# Patient Record
Sex: Female | Born: 1990 | Race: Black or African American | Hispanic: No | Marital: Single | Smoking: Current every day smoker
Health system: Southern US, Community
[De-identification: ages and names within clinical notes are randomized; demographics above are authoritative.]

## PROBLEM LIST (undated history)

## (undated) DIAGNOSIS — S43006A Unspecified dislocation of unspecified shoulder joint, initial encounter: Secondary | ICD-10-CM

## (undated) DIAGNOSIS — F419 Anxiety disorder, unspecified: Secondary | ICD-10-CM

## (undated) DIAGNOSIS — F319 Bipolar disorder, unspecified: Secondary | ICD-10-CM

---

## 2016-02-25 ENCOUNTER — Emergency Department (HOSPITAL_COMMUNITY)
Admission: EM | Admit: 2016-02-25 | Discharge: 2016-02-25 | Disposition: A | Discharge status: Home / Self Care | Payer: Self-pay | Attending: Emergency Medicine | Admitting: Emergency Medicine

## 2016-02-25 ENCOUNTER — Encounter (HOSPITAL_COMMUNITY): Payer: Self-pay | Admitting: Emergency Medicine

## 2016-02-25 DIAGNOSIS — R001 Bradycardia, unspecified: Secondary | ICD-10-CM | POA: Insufficient documentation

## 2016-02-25 DIAGNOSIS — R112 Nausea with vomiting, unspecified: Secondary | ICD-10-CM | POA: Insufficient documentation

## 2016-02-25 DIAGNOSIS — F17200 Nicotine dependence, unspecified, uncomplicated: Secondary | ICD-10-CM | POA: Insufficient documentation

## 2016-02-25 HISTORY — DX: Bipolar disorder, unspecified: F31.9

## 2016-02-25 LAB — COMPREHENSIVE METABOLIC PANEL
ALK PHOS: 53 U/L (ref 38–126)
ALT: 14 U/L (ref 14–54)
AST: 17 U/L (ref 15–41)
Albumin: 4 g/dL (ref 3.5–5.0)
Anion gap: 7 (ref 5–15)
BILIRUBIN TOTAL: 0.7 mg/dL (ref 0.3–1.2)
BUN: 6 mg/dL (ref 6–20)
CALCIUM: 9.5 mg/dL (ref 8.9–10.3)
CO2: 29 mmol/L (ref 22–32)
CREATININE: 0.7 mg/dL (ref 0.44–1.00)
Chloride: 103 mmol/L (ref 101–111)
Glucose, Bld: 97 mg/dL (ref 65–99)
Potassium: 3.6 mmol/L (ref 3.5–5.1)
Sodium: 139 mmol/L (ref 135–145)
TOTAL PROTEIN: 6.6 g/dL (ref 6.5–8.1)

## 2016-02-25 LAB — CBC
HCT: 40.1 % (ref 36.0–46.0)
Hemoglobin: 13.3 g/dL (ref 12.0–15.0)
MCH: 29.2 pg (ref 26.0–34.0)
MCHC: 33.2 g/dL (ref 30.0–36.0)
MCV: 87.9 fL (ref 78.0–100.0)
PLATELETS: 264 10*3/uL (ref 150–400)
RBC: 4.56 MIL/uL (ref 3.87–5.11)
RDW: 13.9 % (ref 11.5–15.5)
WBC: 12.3 10*3/uL — AB (ref 4.0–10.5)

## 2016-02-25 LAB — I-STAT BETA HCG BLOOD, ED (MC, WL, AP ONLY)

## 2016-02-25 LAB — LIPASE, BLOOD: LIPASE: 16 U/L (ref 11–51)

## 2016-02-25 MED ORDER — NAPROXEN 250 MG PO TABS
250.0000 mg | ORAL_TABLET | Freq: Once | ORAL | Status: AC
  Administered 2016-02-25: 250 mg via ORAL
  Filled 2016-02-25: qty 1

## 2016-02-25 MED ORDER — ONDANSETRON 4 MG PO TBDP
4.0000 mg | ORAL_TABLET | Freq: Once | ORAL | Status: AC | PRN
  Administered 2016-02-25: 4 mg via ORAL

## 2016-02-25 MED ORDER — ONDANSETRON 4 MG PO TBDP
ORAL_TABLET | ORAL | Status: AC
  Filled 2016-02-25: qty 1

## 2016-02-25 MED ORDER — ONDANSETRON HCL 4 MG PO TABS: 4.0000 mg | ORAL_TABLET | Freq: Four times a day (QID) | ORAL | 0 refills | Status: DC

## 2016-02-25 MED ORDER — NAPROXEN 500 MG PO TABS: 500.0000 mg | ORAL_TABLET | Freq: Two times a day (BID) | ORAL | 0 refills | Status: DC

## 2016-02-25 NOTE — ED Provider Notes (Signed)
[facility]-EMER[facility]ENCY DEPT Provider Note   CSN: [id number] Arrival date & time: 02/25/16  1655  First Provider Contact:  First MD Initiated Contact with Patient 02/25/16 1915     History   Chief Complaint Chief Complaint  Patient presents with  . Emesis    HPI Sabrina Le is a 25 y.o. female.  The history is provided by the patient. No language interpreter was used.  Emesis   This is a new problem. The current episode started 6 to 12 hours ago. The problem occurs 2 to 4 times per day. The problem has been gradually improving. The emesis has an appearance of stomach contents. There has been no fever. Associated symptoms include abdominal pain. Pertinent negatives include no arthralgias, no chills, no cough, no diarrhea, no fever, no headaches, no myalgias, no sweats and no URI. Risk factors include suspect food intake.    Past Medical History:  Diagnosis Date  . Bipolar disorder (HCC)     There are no active problems to display for this patient.   History reviewed. No pertinent surgical history.  OB History    No data available       Home Medications    Prior to Admission medications   Medication Sig Start Date End Date Taking? Authorizing Provider  naproxen (NAPROSYN) 500 MG tablet Take 1 tablet (500 mg total) by mouth 2 (two) times daily. 02/25/16   Dan Humphreys, MD  ondansetron (ZOFRAN) 4 MG tablet Take 1 tablet (4 mg total) by mouth every 6 (six) hours. 02/25/16   Dan Humphreys, MD    Family History History reviewed. No pertinent family history.  Social History Social History  Substance Use Topics  . Smoking status: Current Every Day Smoker  . Smokeless tobacco: Never Used  . Alcohol use Yes     Allergies   Review of patient's allergies indicates no known allergies.   Review of Systems Review of Systems  Constitutional: Negative for chills and fever.  HENT: Negative for ear pain and sore throat.   Eyes: Negative for pain and visual disturbance.    Respiratory: Negative for cough and shortness of breath.   Cardiovascular: Negative for chest pain and palpitations.  Gastrointestinal: Positive for abdominal pain and vomiting. Negative for diarrhea.  Genitourinary: Negative for dysuria and hematuria.  Musculoskeletal: Negative for arthralgias, back pain and myalgias.  Skin: Negative for color change and rash.  Neurological: Negative for seizures, syncope and headaches.  All other systems reviewed and are negative.    Physical Exam Updated Vital Signs BP 108/72   Pulse (!) 56   Temp 98 F (36.7 C) (Oral)   Resp 18   SpO2 99%   Physical Exam  Constitutional: She appears well-developed and well-nourished. No distress.  HENT:  Head: Normocephalic and atraumatic.  Eyes: Conjunctivae are normal.  Neck: Neck supple.  Cardiovascular: Regular rhythm.  Bradycardia present.   No murmur heard. Pulmonary/Chest: Effort normal and breath sounds normal. No respiratory distress.  Abdominal: Soft. She exhibits no distension. There is no tenderness. There is no guarding.  Musculoskeletal: She exhibits no edema.  Neurological: She is alert.  Skin: Skin is warm and dry.  Psychiatric: She has a normal mood and affect.  Nursing note and vitals reviewed.    ED Treatments / Results  Labs (all labs ordered are listed, but only abnormal results are displayed) Labs Reviewed  CBC - Abnormal; Notable for the following:       Result Value   WBC 12.3 (*)  All other components within normal limits  LIPASE, BLOOD  COMPREHENSIVE METABOLIC PANEL  I-STAT BETA HCG BLOOD, ED ([facility], [facility], [facility] ONLY)    EKG  EKG Interpretation None       Radiology No results found.  Procedures Procedures (including critical care time)  Medications Ordered in ED Medications  ondansetron (ZOFRAN-ODT) 4 MG disintegrating tablet (not administered)  ondansetron (ZOFRAN-ODT) disintegrating tablet 4 mg (4 mg Oral Given 02/25/16 1719)  naproxen (NAPROSYN) tablet 250  mg (250 mg Oral Given 02/25/16 1945)     Initial Impression / Assessment and Plan / ED Course  I have reviewed the triage vital signs and the nursing notes.  Pertinent labs & imaging results that were available during my care of the patient were reviewed by me and considered in my medical decision making (see chart for details).  Clinical Course    Patient is an otherwise healthy 25 year old female who presents for 1 day of nausea, vomiting 2, mild abdominal cramping. She drank alcohol last evening for the first time in several weeks and suffered this onset of symptoms following eating [employer]aff[employer] morning. She also started her menstrual cycle today. She denies any current symptoms after receiving Zofran in triage area.  Patient was sinus bradycardia, soft nontender abdomen. Patient endorses no further symptoms and wishes to eat or drink.  Reviewed laboratory studies show a negative pregnancy test, electrolytes within normal limits.  Ordered naproxen in the emergency department and the patient crackers and ginger ale. She was able to tolerate without difficulty.  Patient without any pelvic symptoms. Will discharge with naproxen, Zofran, referral to gynecology for painful menstruation, referral PCP for establishing primary care physician.  Patient ambulatory in no acute distress at time of discharge.  I discussed case with my attending, Dr. Clydene Pugh.    Final Clinical Impressions(s) / ED Diagnoses   Final diagnoses:  Non-intractable vomiting with nausea, vomiting of unspecified type    New Prescriptions New Prescriptions   NAPROXEN (NAPROSYN) 500 MG TABLET    Take 1 tablet (500 mg total) by mouth 2 (two) times daily.   ONDANSETRON (ZOFRAN) 4 MG TABLET    Take 1 tablet (4 mg total) by mouth every 6 (six) hours.     Dan Humphreys, MD 02/25/16 3329    Lyndal Pulley, MD 02/26/16 Moses Manners

## 2016-02-25 NOTE — ED Triage Notes (Signed)
Pt sts vomiting after drinking ETOH last night and started her period today and c/o cramping

## 2016-12-20 ENCOUNTER — Emergency Department (HOSPITAL_COMMUNITY)
Admission: EM | Admit: 2016-12-20 | Discharge: 2016-12-20 | Disposition: A | Discharge status: Home / Self Care | Payer: Self-pay | Attending: Dermatology | Admitting: Dermatology

## 2016-12-20 ENCOUNTER — Encounter (HOSPITAL_COMMUNITY): Payer: Self-pay

## 2016-12-20 DIAGNOSIS — Z5321 Procedure and treatment not carried out due to patient leaving prior to being seen by health care provider: Secondary | ICD-10-CM | POA: Insufficient documentation

## 2016-12-20 DIAGNOSIS — R0981 Nasal congestion: Secondary | ICD-10-CM | POA: Insufficient documentation

## 2016-12-20 NOTE — ED Triage Notes (Signed)
States nasal congestion for a couple of days no fever with some right ear pressure and some bleeding when she blows her nose.

## 2017-02-02 ENCOUNTER — Emergency Department (HOSPITAL_COMMUNITY): Payer: Self-pay

## 2017-02-02 ENCOUNTER — Emergency Department (HOSPITAL_COMMUNITY)
Admission: EM | Admit: 2017-02-02 | Discharge: 2017-02-02 | Disposition: A | Discharge status: Home / Self Care | Payer: Self-pay | Attending: Emergency Medicine | Admitting: Emergency Medicine

## 2017-02-02 ENCOUNTER — Encounter (HOSPITAL_COMMUNITY): Payer: Self-pay | Admitting: Emergency Medicine

## 2017-02-02 DIAGNOSIS — Y33XXXA Other specified events, undetermined intent, initial encounter: Secondary | ICD-10-CM | POA: Insufficient documentation

## 2017-02-02 DIAGNOSIS — F17210 Nicotine dependence, cigarettes, uncomplicated: Secondary | ICD-10-CM | POA: Insufficient documentation

## 2017-02-02 DIAGNOSIS — Y999 Unspecified external cause status: Secondary | ICD-10-CM | POA: Insufficient documentation

## 2017-02-02 DIAGNOSIS — S43004A Unspecified dislocation of right shoulder joint, initial encounter: Secondary | ICD-10-CM | POA: Insufficient documentation

## 2017-02-02 DIAGNOSIS — Z79899 Other long term (current) drug therapy: Secondary | ICD-10-CM | POA: Insufficient documentation

## 2017-02-02 DIAGNOSIS — Y939 Activity, unspecified: Secondary | ICD-10-CM | POA: Insufficient documentation

## 2017-02-02 DIAGNOSIS — Y92009 Unspecified place in unspecified non-institutional (private) residence as the place of occurrence of the external cause: Secondary | ICD-10-CM | POA: Insufficient documentation

## 2017-02-02 MED ORDER — IBUPROFEN 600 MG PO TABS: 600.0000 mg | ORAL_TABLET | Freq: Four times a day (QID) | ORAL | 0 refills | Status: DC | PRN

## 2017-02-02 MED ORDER — ONDANSETRON HCL 4 MG PO TABS: 4.0000 mg | ORAL_TABLET | Freq: Three times a day (TID) | ORAL | 0 refills | Status: DC | PRN

## 2017-02-02 MED ORDER — PROPOFOL 10 MG/ML IV BOLUS
INTRAVENOUS | Status: AC | PRN
  Administered 2017-02-02: 2450 ug via INTRAVENOUS

## 2017-02-02 MED ORDER — PROPOFOL 10 MG/ML IV BOLUS
0.5000 mg/kg | INTRAVENOUS | Status: DC | PRN
Start: 2017-02-02 — End: 2017-02-03

## 2017-02-02 MED ORDER — PROPOFOL 10 MG/ML IV BOLUS
INTRAVENOUS | Status: AC
  Filled 2017-02-02: qty 20

## 2017-02-02 MED ORDER — ONDANSETRON HCL 4 MG/2ML IJ SOLN
4.0000 mg | Freq: Once | INTRAMUSCULAR | Status: AC
  Administered 2017-02-02: 4 mg via INTRAVENOUS
  Filled 2017-02-02: qty 2

## 2017-02-02 MED ORDER — HYDROMORPHONE HCL 1 MG/ML IJ SOLN
1.0000 mg | Freq: Once | INTRAMUSCULAR | Status: AC
  Administered 2017-02-02: 1 mg via INTRAVENOUS
  Filled 2017-02-02: qty 1

## 2017-02-02 NOTE — Progress Notes (Signed)
Orthopedic Tech Progress Note Patient Details:  Sabrina Le [birth year] [id number]  Ortho Devices Type of Ortho Device: Shoulder immobilizer Ortho Device/Splint Location: applied sling immobilizer to pt right arm shoulder.  right shoulder Ortho Device/Splint Interventions: Application   Alvina ChouWilliams, Carlia Bomkamp C 02/02/2017, 9:40 PM

## 2017-02-02 NOTE — ED Triage Notes (Signed)
Per [facility],  Pt reports waking up with her R shoulder out of place. Pt has hx of R shoulder dislocation. Pt pain 10/10. Pt received 200 mcg fentanyl en route with minimal relief.

## 2017-02-02 NOTE — ED Provider Notes (Signed)
[facility] Provider Note   CSN: [id number] Arrival date & time: 02/02/17  1658     History   Chief Complaint Chief Complaint  Patient presents with  . Shoulder Injury    HPI Sabrina Le is a 26 y.o. female.  HPI Patient presents to the ED for right shoulder pain that occurred after waking up this morning. She states that she has a history of shoulder dislocation initially 1.5 years ago. She states that she intermittently gets shoulder dislocations which she is able to "pop back into place." She states that she tried today and she is unable to and continues to have severe pain. She denies any injury, falls. Denies seeing any orthopedist in the past for this issue.  Past Medical History:  Diagnosis Date  . Bipolar disorder (HCC)     There are no active problems to display for this patient.   History reviewed. No pertinent surgical history.  OB History    No data available       Home Medications    Prior to Admission medications   Medication Sig Start Date End Date Taking? Authorizing Provider  ibuprofen (ADVIL,MOTRIN) 600 MG tablet Take 1 tablet (600 mg total) by mouth every 6 (six) hours as needed. 02/02/17   Carmen Vallecillo, PA-C  naproxen (NAPROSYN) 500 MG tablet Take 1 tablet (500 mg total) by mouth 2 (two) times daily. 02/25/16   Dan Humphreys, MD  ondansetron (ZOFRAN) 4 MG tablet Take 1 tablet (4 mg total) by mouth every 8 (eight) hours as needed for nausea or vomiting. 02/02/17   Dietrich Pates, PA-C    Family History No family history on file.  Social History Social History  Substance Use Topics  . Smoking status: Current Every Day Smoker    Packs/day: 1.00  . Smokeless tobacco: Never Used  . Alcohol use Yes     Allergies   Patient has no known allergies.   Review of Systems Review of Systems  Constitutional: Negative for appetite change, chills and fever.  HENT: Negative for ear pain, rhinorrhea, sneezing and sore throat.   Eyes: Negative  for photophobia and visual disturbance.  Respiratory: Negative for cough, chest tightness, shortness of breath and wheezing.   Cardiovascular: Negative for chest pain and palpitations.  Gastrointestinal: Negative for abdominal pain, blood in stool, constipation, diarrhea, nausea and vomiting.  Genitourinary: Negative for dysuria, hematuria and urgency.  Musculoskeletal: Positive for arthralgias and myalgias.  Skin: Negative for rash.  Neurological: Negative for dizziness, weakness and light-headedness.     Physical Exam Updated Vital Signs BP 128/82   Pulse (!) 43   Temp 98.2 F (36.8 C)   Resp 15   Wt 77.1 kg (170 lb)   SpO2 100%   BMI 26.63 kg/m   Physical Exam  Constitutional: She appears well-developed and well-nourished. No distress.  HENT:  Head: Normocephalic and atraumatic.  Nose: Nose normal.  Eyes: Conjunctivae and EOM are normal. Left eye exhibits no discharge. No scleral icterus.  Neck: Normal range of motion. Neck supple.  Cardiovascular: Normal rate, regular rhythm, normal heart sounds and intact distal pulses.  Exam reveals no gallop and no friction rub.   No murmur heard. Pulmonary/Chest: Effort normal and breath sounds normal. No respiratory distress.  Abdominal: Soft. Bowel sounds are normal. She exhibits no distension. There is no tenderness. There is no guarding.  Musculoskeletal: Normal range of motion. She exhibits tenderness and deformity (Right shoulder). She exhibits no edema.  Neurological: She is alert. She  exhibits normal muscle tone. Coordination normal.  Skin: Skin is warm and dry. No rash noted. She is not diaphoretic.  Psychiatric: She has a normal mood and affect.  Nursing note and vitals reviewed.    ED Treatments / Results  Labs (all labs ordered are listed, but only abnormal results are displayed) Labs Reviewed - No data to display  EKG  EKG Interpretation  Date/Time:  Friday February 02 2017 17:42:30 EDT Ventricular Rate:  47 PR  Interval:    QRS Duration: 82 QT Interval:  441 QTC Calculation: 390 R Axis:   86 Text Interpretation:  Sinus bradycardia Baseline wander in lead(s) I II No old tracing to compare Confirmed by Linwood Dibbles [phone]) on 02/02/2017 5:47:21 PM       Radiology Dg Shoulder Right  Result Date: 02/02/2017 CLINICAL DATA:  Shoulder dislocation. Right shoulder pain after waking. EXAM: RIGHT SHOULDER - 2+ VIEW COMPARISON:  None. FINDINGS: The right shoulder is dislocated anteriorly and inferiorly. No definite fracture is present. The clavicle is intact. The visualized hemithorax is clear. IMPRESSION: Anterior inferior right shoulder dislocation without definite fracture. Electronically Signed   By: Marin Roberts M.D.   On: 02/02/2017 18:45   Dg Shoulder Right Portable  Result Date: 02/02/2017 CLINICAL DATA:  Post reduction EXAM: PORTABLE RIGHT SHOULDER COMPARISON:  Earlier study of 02/02/2017 FINDINGS: Previously identified RIGHT glenohumeral dislocation appears reduced on single AP view. No orthogonal view for correlation. No fractures identified. AC joint alignment normal. Visualized RIGHT ribs unremarkable. Osseous mineralization normal. IMPRESSION: Apparent reduction of previously identified RIGHT glenohumeral dislocation on single AP view. Electronically Signed   By: Ulyses Southward M.D.   On: 02/02/2017 21:18    Procedures Procedures (including critical care time)  Medications Ordered in ED Medications  propofol (DIPRIVAN) 10 mg/mL bolus/IV push 38.6 mg (not administered)  HYDROmorphone (DILAUDID) injection 1 mg (1 mg Intravenous Given 02/02/17 1737)  HYDROmorphone (DILAUDID) injection 1 mg (1 mg Intravenous Given 02/02/17 2024)  propofol (DIPRIVAN) 10 mg/mL bolus/IV push (2,450 mcg Intravenous Given 02/02/17 2050)  ondansetron (ZOFRAN) injection 4 mg (4 mg Intravenous Given 02/02/17 2248)     Initial Impression / Assessment and Plan / ED Course  I have reviewed the triage vital signs and the  nursing notes.  Pertinent labs & imaging results that were available during my care of the patient were reviewed by me and considered in my medical decision making (see chart for details).     Patient presents to the ED for right shoulder dislocation that began a few hours prior to arrival. She states that she has a history of intermittent dislocation and the shoulder but is able to usually pop it back into place on her own. Patient states that she has tried but unsuccessfully she continues to have pain in the shoulder. Patient has obvious deformity on physical exam of the right shoulder. X-ray showed anterior inferior right shoulder dislocation without definite fracture. Procedural sedation was done by Dr. Lynelle Doctor and he manually reduce this dislocation. Post reduction films showed successful reduction of the right shoulder. Patient given Dilaudid and propofol, and continues to have bradycardia. Patient vomited several times here in the ED. Patient unsuccessful by mouth challenge. Patient given Zofran with improvement in nausea and vomiting. Will discharge patient with ibuprofen and Zofran to be taken as needed. Will be referred to orthopedics for further evaluation. Patient appears stable for discharge at this time. Strict return precautions given.  Patient discussed with and seen by Dr. Lynelle Doctor.  Final Clinical Impressions(s) / ED Diagnoses   Final diagnoses:  Dislocation of right shoulder joint, initial encounter    New Prescriptions Discharge Medication List as of 02/02/2017 11:06 PM    START taking these medications   Details  ibuprofen (ADVIL,MOTRIN) 600 MG tablet Take 1 tablet (600 mg total) by mouth every 6 (six) hours as needed., Starting Fri 02/02/2017, Print         FosterKhatri, PisekHina, PA-C 02/03/17 [phone]

## 2017-02-02 NOTE — ED Provider Notes (Signed)
Pt is a 26 y.o. female who presents with  Chief Complaint  Patient presents with  . Shoulder Injury  Patient presented to the emergency room with right shoulder pain. Patient feels like she dislocated her shoulder  Physical Exam  Constitutional: No distress.  HENT:  Head: Normocephalic and atraumatic.  Mouth/Throat: No oropharyngeal exudate.  Eyes: Conjunctivae are normal. Left eye exhibits no discharge. No scleral icterus.  Neck: No tracheal deviation present. No thyromegaly present.  Cardiovascular: Normal rate and regular rhythm.   Pulmonary/Chest: Effort normal and breath sounds normal. No stridor. No respiratory distress.  Abdominal: She exhibits no distension.  Musculoskeletal: She exhibits no tenderness.       Right shoulder: She exhibits decreased range of motion and deformity.  Neurological: She is alert.  Skin: Skin is warm. No rash noted. She is not diaphoretic. No erythema.  Psychiatric: Affect normal.      .Sedation Date/Time: 02/02/2017 8:57 PM Performed by: Linwood DibblesKNAPP, Artem Bunte Authorized by: Linwood DibblesKNAPP, Ralphael Southgate   Consent:    Consent obtained:  Written   Consent given by:  Patient   Risks discussed:  Dysrhythmia, inadequate sedation and nausea   Alternatives discussed:  Analgesia without sedation Indications:    Procedure performed:  Dislocation reduction   Procedure necessitating sedation performed by:  Physician performing sedation   Intended level of sedation:  Deep Pre-sedation assessment:    Time since last food or drink:  4 hrs ago   ASA classification: class 1 - normal, healthy patient     Neck mobility: normal     Mouth opening:  3 or more finger widths   Thyromental distance:  4 finger widths   Mallampati score:  I - soft palate, uvula, fauces, pillars visible   Pre-sedation assessments completed and reviewed: airway patency, cardiovascular function, hydration status, mental status, nausea/vomiting, pain level, respiratory function and temperature     History of  difficult intubation: no     Pre-sedation assessment completed:  02/02/2017 8:28 PM Immediate pre-procedure details:    Reassessment: Patient reassessed immediately prior to procedure     Reviewed: vital signs     Verified: bag valve mask available, emergency equipment available, oxygen available and suction available   Procedure details (see MAR for exact dosages):    Sedation start time:  02/02/2017 8:48 PM   Preoxygenation:  Nasal cannula   Sedation:  Propofol   Intra-procedure monitoring:  Blood pressure monitoring, cardiac monitor, continuous capnometry, continuous pulse oximetry, frequent LOC assessments and frequent vital sign checks   Intra-procedure events: none     Sedation end time:  02/02/2017 8:58 PM Post-procedure details:    Post-sedation assessment completed:  02/02/2017 8:58 PM   Estimated blood loss (see I/O flowsheets): no     Post-sedation assessments completed and reviewed: airway patency, cardiovascular function, hydration status, mental status, nausea/vomiting, pain level, respiratory function and temperature     Specimens recovered:  None   Patient is stable for discharge or admission: yes     Patient tolerance:  Tolerated well, no immediate complications Reduction of dislocation Date/Time: 02/02/2017 8:59 PM Performed by: Linwood DibblesKNAPP, Tacarra Justo Authorized by: Linwood DibblesKNAPP, Trenyce Loera  Consent: Written consent obtained. Risks and benefits: risks, benefits and alternatives were discussed Consent given by: patient Local anesthesia used: no  Anesthesia: Local anesthesia used: no  Sedation: Patient sedated: yes Comments: Traction counter traction, xray confirmation ordered      EKG Interpretation  Date/Time:  Friday February 02 2017 17:42:30 EDT Ventricular Rate:  47 PR Interval:  QRS Duration: 82 QT Interval:  441 QTC Calculation: 390 R Axis:   86 Text Interpretation:  Sinus bradycardia Baseline wander in lead(s) I II No old tracing to compare Confirmed by Linwood Dibbles [phone]) on  02/02/2017 5:47:21 PM       Dislocation of right shoulder joint, initial encounter   Medical screening examination/treatment/procedure(s) were conducted as a shared visit with non-physician practitioner(s) and myself.  I personally evaluated the patient during the encounter.        Linwood Dibbles, MD 02/02/17 2059

## 2017-02-02 NOTE — ED Notes (Signed)
Patient transported to X-ray 

## 2017-02-02 NOTE — Discharge Instructions (Signed)
Return to ED for worsening pain, injury, chest pain, shortness of breath, numbness or weakness.

## 2017-02-12 ENCOUNTER — Encounter (HOSPITAL_COMMUNITY): Payer: Self-pay | Admitting: Emergency Medicine

## 2017-02-12 ENCOUNTER — Emergency Department (HOSPITAL_COMMUNITY)
Admission: EM | Admit: 2017-02-12 | Discharge: 2017-02-13 | Disposition: A | Discharge status: Home / Self Care | Payer: Self-pay | Attending: Emergency Medicine | Admitting: Emergency Medicine

## 2017-02-12 ENCOUNTER — Emergency Department (HOSPITAL_COMMUNITY): Payer: Self-pay

## 2017-02-12 DIAGNOSIS — S43004A Unspecified dislocation of right shoulder joint, initial encounter: Secondary | ICD-10-CM | POA: Insufficient documentation

## 2017-02-12 DIAGNOSIS — Y9389 Activity, other specified: Secondary | ICD-10-CM | POA: Insufficient documentation

## 2017-02-12 DIAGNOSIS — X501XXA Overexertion from prolonged static or awkward postures, initial encounter: Secondary | ICD-10-CM | POA: Insufficient documentation

## 2017-02-12 DIAGNOSIS — Y999 Unspecified external cause status: Secondary | ICD-10-CM | POA: Insufficient documentation

## 2017-02-12 DIAGNOSIS — Y929 Unspecified place or not applicable: Secondary | ICD-10-CM | POA: Insufficient documentation

## 2017-02-12 DIAGNOSIS — M25511 Pain in right shoulder: Secondary | ICD-10-CM

## 2017-02-12 DIAGNOSIS — F17200 Nicotine dependence, unspecified, uncomplicated: Secondary | ICD-10-CM | POA: Insufficient documentation

## 2017-02-12 MED ORDER — ONDANSETRON HCL 4 MG/2ML IJ SOLN
4.0000 mg | Freq: Once | INTRAMUSCULAR | Status: AC
  Administered 2017-02-12: 4 mg via INTRAVENOUS
  Filled 2017-02-12: qty 2

## 2017-02-12 MED ORDER — FENTANYL CITRATE (PF) 100 MCG/2ML IJ SOLN
50.0000 ug | INTRAMUSCULAR | Status: AC | PRN
  Administered 2017-02-12 – 2017-02-13 (×2): 50 ug via INTRAVENOUS
  Filled 2017-02-12: qty 2

## 2017-02-12 NOTE — ED Notes (Signed)
[facility] Expected date:  Expected time:  Means of arrival:  Comments: 26 yr old female displaced shoulder.

## 2017-02-12 NOTE — ED Triage Notes (Signed)
Per EMS, pt. From home with compliant of right shoulder displacement  while getting a massage from her girlfriend at 1030 this evening. Pt. Was in her bed getting massage and heard a pop. Pt. Had the same shoulder displacement two weeks ago after a fall. Received 100 mcg of IV fentanyl via EMS. Alert and oriented x4.

## 2017-02-13 ENCOUNTER — Emergency Department (HOSPITAL_COMMUNITY): Payer: Self-pay

## 2017-02-13 MED ORDER — METHOCARBAMOL 500 MG PO TABS
750.0000 mg | ORAL_TABLET | Freq: Once | ORAL | Status: AC
  Administered 2017-02-13: 750 mg via ORAL
  Filled 2017-02-13: qty 2

## 2017-02-13 MED ORDER — NAPROXEN 500 MG PO TABS: 500.0000 mg | ORAL_TABLET | Freq: Two times a day (BID) | ORAL | 0 refills | Status: DC | PRN

## 2017-02-13 MED ORDER — LORAZEPAM 2 MG/ML IJ SOLN
2.0000 mg | Freq: Once | INTRAMUSCULAR | Status: AC
  Administered 2017-02-13: 2 mg via INTRAVENOUS
  Filled 2017-02-13: qty 1

## 2017-02-13 MED ORDER — FENTANYL CITRATE (PF) 100 MCG/2ML IJ SOLN
50.0000 ug | Freq: Once | INTRAMUSCULAR | Status: AC
  Administered 2017-02-13: 50 ug via INTRAVENOUS
  Filled 2017-02-13: qty 2

## 2017-02-13 MED ORDER — HYDROCODONE-ACETAMINOPHEN 5-325 MG PO TABS: 1.0000 | ORAL_TABLET | Freq: Four times a day (QID) | ORAL | 0 refills | Status: DC | PRN

## 2017-02-13 NOTE — ED Notes (Addendum)
Pt made aware of delay. Discussed w/ MD anxiety and pain medication possibilities. No new orders.

## 2017-02-13 NOTE — Discharge Instructions (Signed)
Wear shoulder sling at all times until you see the orthopedist. Ice your shoulder throughout the day, using an ice pack for 20 minutes at a time every hour. Alternate between naprosyn and norco for pain relief. Do not drive or operate machinery with pain medication use. Call orthopedic follow up today or tomorrow to schedule followup appointment for recheck of ongoing shoulder pain in 1 week for recheck of pain and for ongoing management of your repeated shoulder dislocations. Return to the ER for changes or worsening symptoms.

## 2017-02-13 NOTE — ED Provider Notes (Signed)
[facility]-EMERGENCY DEPT Provider Note   CSN: [id number] Arrival date & time: 02/12/17  2301     History   Chief Complaint Chief Complaint  Patient presents with  . Dislocation    Right Shoulder    HPI Sabrina Le is a 26 y.o. female with a PMHx of bipolar disorder and recurrent shoulder dislocation, who presents to the ED with complaints of right shoulder dislocation that occurred around 10 PM. Patient states that she was lying in a prone position with her arms extended over her head getting a massage when she felt her right shoulder dislocate. This is happened multiple times over the last 2 years, she dislocated it when she fell from a treadmill 2 years ago and since then she's had more than 10 dislocations. She has been able to relocate them herself, however this is the second time that she's had to come to the emergency room for assistance with the dislocation. The last ED visit was 02/02/17 when she required propofol sedation for reduction. She has never seen an orthopedic before. She describes her pain as 10/10 intermittent sharp nonradiating right shoulder pain that worsens with movement and has been mildly improved with fentanyl 50 g. She denies swelling, bruising, fevers, chills, CP, SOB, abd pain, N/V/D/C, hematuria, dysuria, numbness, tingling, focal distal arm weakness, or any other complaints at this time.    The history is provided by the patient and medical records. No language interpreter was used.  Shoulder Pain   This is a recurrent problem. The current episode started 3 to 5 hours ago. The problem occurs constantly. The problem has not changed since onset.The pain is present in the right shoulder. The quality of the pain is described as sharp. The pain is at a severity of 10/10. The pain is severe. Associated symptoms include limited range of motion. Pertinent negatives include no numbness and no tingling. Exacerbated by: movement. Treatments tried: Fentanyl. The treatment  provided mild relief. There has been a history of trauma.    Past Medical History:  Diagnosis Date  . Bipolar disorder (HCC)     There are no active problems to display for this patient.   History reviewed. No pertinent surgical history.  OB History    No data available       Home Medications    Prior to Admission medications   Medication Sig Start Date End Date Taking? Authorizing Provider  ibuprofen (ADVIL,MOTRIN) 600 MG tablet Take 1 tablet (600 mg total) by mouth every 6 (six) hours as needed. Patient not taking: Reported on 02/13/2017 02/02/17   Dietrich Pates, PA-C  naproxen (NAPROSYN) 500 MG tablet Take 1 tablet (500 mg total) by mouth 2 (two) times daily. Patient not taking: Reported on 02/13/2017 02/25/16   Dan Humphreys, MD  ondansetron (ZOFRAN) 4 MG tablet Take 1 tablet (4 mg total) by mouth every 8 (eight) hours as needed for nausea or vomiting. Patient not taking: Reported on 02/13/2017 02/02/17   Dietrich Pates, PA-C    Family History History reviewed. No pertinent family history.  Social History Social History  Substance Use Topics  . Smoking status: Current Every Day Smoker    Packs/day: 1.00  . Smokeless tobacco: Never Used  . Alcohol use Yes     Allergies   Patient has no known allergies.   Review of Systems Review of Systems  Constitutional: Negative for chills and fever.  Respiratory: Negative for shortness of breath.   Cardiovascular: Negative for chest pain.  Gastrointestinal: Negative  for abdominal pain, constipation, diarrhea, nausea and vomiting.  Genitourinary: Negative for dysuria and hematuria.  Musculoskeletal: Positive for arthralgias. Negative for joint swelling.  Skin: Negative for color change.  Allergic/Immunologic: Negative for immunocompromised state.  Neurological: Negative for tingling, weakness and numbness.  Psychiatric/Behavioral: Negative for confusion.   All other systems reviewed and are negative for acute change except as  noted in the HPI.    Physical Exam Updated Vital Signs BP (!) 120/103 (BP Location: Left Arm)   Pulse (!) 50   Temp 97.8 F (36.6 C) (Oral)   Resp 15   SpO2 100%   Physical Exam  Constitutional: She is oriented to person, place, and time. Vital signs are normal. She appears well-developed and well-nourished.  Non-toxic appearance. She appears distressed (crying hysterically).  Afebrile, nontoxic, crying hysterically  HENT:  Head: Normocephalic and atraumatic.  Mouth/Throat: Oropharynx is clear and moist and mucous membranes are normal.  Eyes: Conjunctivae and EOM are normal. Right eye exhibits no discharge. Left eye exhibits no discharge.  Neck: Normal range of motion. Neck supple.  Cardiovascular: Normal rate, regular rhythm, normal heart sounds and intact distal pulses.  Exam reveals no gallop and no friction rub.   No murmur heard. Pulmonary/Chest: Effort normal and breath sounds normal. No respiratory distress. She has no decreased breath sounds. She has no wheezes. She has no rhonchi. She has no rales.  Abdominal: Soft. Normal appearance and bowel sounds are normal. She exhibits no distension. There is no tenderness. There is no rigidity, no rebound, no guarding, no CVA tenderness, no tenderness at McBurney's point and negative Murphy's sign.  Musculoskeletal:       Right shoulder: She exhibits decreased range of motion, tenderness, deformity (dislocated) and spasm. She exhibits no swelling, no effusion, no crepitus and normal pulse.  R shoulder with diminished ROM, obvious anterior dislocation, holding in internal rotation and adduction, with diffuse TTP but no focal bony TTP of the distal arm, +muscular spasms around scapula with diffuse muscular TTP, no swelling/effusion, no bruising or erythema, no warmth, no crepitus, unable to perform apley scratch, resisted int/ext rotation, or empty can testing due to pain and dislocation. Distal strength and sensation grossly intact, distal  pulses intact, soft compartments  Neurological: She is alert and oriented to person, place, and time. She has normal strength. No sensory deficit.  Skin: Skin is warm, dry and intact. No rash noted.  Psychiatric: She has a normal mood and affect.  Nursing note and vitals reviewed.    ED Treatments / Results  Labs (all labs ordered are listed, but only abnormal results are displayed) Labs Reviewed - No data to display  EKG  EKG Interpretation None       Radiology Dg Shoulder Right  Result Date: 02/12/2017 CLINICAL DATA:  Awakened from sleep by upper extremity pain. EXAM: RIGHT SHOULDER - 2+ VIEW COMPARISON:  02/02/2017 FINDINGS: The anterior right glenohumeral dislocation.  No acute fracture. IMPRESSION: Right shoulder dislocation. Electronically Signed   By: Ellery Plunkaniel R Mitchell M.D.   On: 02/12/2017 23:54   Dg Shoulder Right Portable  Result Date: 02/13/2017 CLINICAL DATA:  Right shoulder dislocation EXAM: PORTABLE RIGHT SHOULDER COMPARISON:  02/12/2017 FINDINGS: Two views of the right shoulder confirm successful reduction of the glenohumeral dislocation. No fracture is evident. IMPRESSION: Successful reduction. Electronically Signed   By: Ellery Plunkaniel R Mitchell M.D.   On: 02/13/2017 05:01    Procedures Reduction of dislocation Date/Time: 02/13/2017 4:30 AM Performed by: Rhona RaiderSTREET, Harumi Yamin Authorized by: Rhona RaiderSTREET, Malarie Tappen  Consent: Verbal consent obtained. Risks and benefits: risks, benefits and alternatives were discussed Consent given by: patient Patient understanding: patient states understanding of the procedure being performed Patient consent: the patient's understanding of the procedure matches consent given Imaging studies: imaging studies available Patient identity confirmed: verbally with patient Local anesthesia used: no  Anesthesia: Local anesthesia used: no  Sedation: Patient sedated: no Patient tolerance: Patient tolerated the procedure well with no immediate  complications Comments: Stimson technique for shoulder dislocation reduction; weights applied in sock attached to right arm, and hung over side of bed.    (including critical care time)  Medications Ordered in ED Medications  fentaNYL (SUBLIMAZE) injection 50 mcg (50 mcg Intravenous Given 02/13/17 0016)  ondansetron (ZOFRAN) injection 4 mg (4 mg Intravenous Given 02/12/17 2345)  methocarbamol (ROBAXIN) tablet 750 mg (750 mg Oral Given 02/13/17 0305)  LORazepam (ATIVAN) injection 2 mg (2 mg Intravenous Given 02/13/17 0304)  fentaNYL (SUBLIMAZE) injection 50 mcg (50 mcg Intravenous Given 02/13/17 0305)     Initial Impression / Assessment and Plan / ED Course  I have reviewed the triage vital signs and the nursing notes.  Pertinent labs & imaging results that were available during my care of the patient were reviewed by me and considered in my medical decision making (see chart for details).     26 y.o. female here with R shoulder dislocation; this has happened several times in the past, most recently 02/02/17. Tonight it was just during a massage while belly down that caused it to pop out. On exam, pt hysterically crying, R shoulder dislocated anteriorly, NVI with soft compartments. Will attempt Stimson maneuver with weights, and give fentanyl/ativan/robaxin to attempt this technique. If unsuccessful, may need propofol to reduce. Will reassess after reduction attempted without sedation.   4:42 AM Reduction appears to have been successful, pt's shoulder looks reduced, able to abduct more and externally rotate more without difficulty. Pain significantly controlled. Will apply shoulder immobilizer and get post-reduction films to ensure successful reduction.   6:02 AM Post reduction films confirm successful reduction of dislocation. Sling applied, advised use of this at all times until she's seen by the orthopedist. Ice application advised. Will rx pain meds, and have her f/up with orthopedist in 1  week for ongoing management of her recurrent shoulder dislocations. [place] reviewed prior to dispensing controlled substance medications, and 1 year search was notable for: none found. Risks/benefits/alternatives and expectations discussed regarding controlled substances. Side effects of medications discussed. Informed consent obtained. I explained the diagnosis and have given explicit precautions to return to the ER including for any other new or worsening symptoms. The patient understands and accepts the medical plan as it's been dictated and I have answered their questions. Discharge instructions concerning home care and prescriptions have been given. The patient is STABLE and is discharged to home in good condition.    Final Clinical Impressions(s) / ED Diagnoses   Final diagnoses:  Dislocation of right shoulder joint, initial encounter  Acute pain of right shoulder    New Prescriptions New Prescriptions   HYDROCODONE-ACETAMINOPHEN (NORCO) 5-325 MG TABLET    Take 1 tablet by mouth every 6 (six) hours as needed for severe pain.   NAPROXEN (NAPROSYN) 500 MG TABLET    Take 1 tablet (500 mg total) by mouth 2 (two) times daily as needed for mild pain, moderate pain or headache (TAKE WITH MEALS.).     [address], [place], [place] 02/13/17 1610    Palumbo, April, MD 02/13/17 [phone]

## 2017-02-13 NOTE — ED Notes (Signed)
Pt voicing frustration about delay in care. This RN explained reason for delay.

## 2017-02-13 NOTE — ED Notes (Signed)
Bed: [facility]23 Expected date:  Expected time:  Means of arrival:  Comments: Room 6 

## 2017-03-07 ENCOUNTER — Emergency Department (HOSPITAL_COMMUNITY): Payer: Self-pay

## 2017-03-07 ENCOUNTER — Ambulatory Visit (INDEPENDENT_AMBULATORY_CARE_PROVIDER_SITE_OTHER): Payer: Self-pay

## 2017-03-07 ENCOUNTER — Encounter (HOSPITAL_COMMUNITY): Payer: Self-pay | Admitting: Emergency Medicine

## 2017-03-07 ENCOUNTER — Ambulatory Visit (HOSPITAL_COMMUNITY)
Admission: EM | Admit: 2017-03-07 | Discharge: 2017-03-07 | Disposition: A | Discharge status: Nursing Home (Includes ICF) | Payer: Self-pay | Attending: Family Medicine | Admitting: Family Medicine

## 2017-03-07 ENCOUNTER — Encounter (HOSPITAL_COMMUNITY): Payer: Self-pay

## 2017-03-07 ENCOUNTER — Emergency Department (HOSPITAL_COMMUNITY)
Admission: EM | Admit: 2017-03-07 | Discharge: 2017-03-07 | Disposition: A | Discharge status: Home / Self Care | Payer: Self-pay | Attending: Emergency Medicine | Admitting: Emergency Medicine

## 2017-03-07 DIAGNOSIS — S43004D Unspecified dislocation of right shoulder joint, subsequent encounter: Secondary | ICD-10-CM

## 2017-03-07 DIAGNOSIS — F17210 Nicotine dependence, cigarettes, uncomplicated: Secondary | ICD-10-CM | POA: Insufficient documentation

## 2017-03-07 DIAGNOSIS — Y9384 Activity, sleeping: Secondary | ICD-10-CM | POA: Insufficient documentation

## 2017-03-07 DIAGNOSIS — Y998 Other external cause status: Secondary | ICD-10-CM | POA: Insufficient documentation

## 2017-03-07 DIAGNOSIS — M21821 Other specified acquired deformities of right upper arm: Secondary | ICD-10-CM

## 2017-03-07 DIAGNOSIS — M25511 Pain in right shoulder: Secondary | ICD-10-CM

## 2017-03-07 DIAGNOSIS — Z8739 Personal history of other diseases of the musculoskeletal system and connective tissue: Secondary | ICD-10-CM | POA: Insufficient documentation

## 2017-03-07 DIAGNOSIS — S43004A Unspecified dislocation of right shoulder joint, initial encounter: Secondary | ICD-10-CM | POA: Insufficient documentation

## 2017-03-07 DIAGNOSIS — X501XXA Overexertion from prolonged static or awkward postures, initial encounter: Secondary | ICD-10-CM | POA: Insufficient documentation

## 2017-03-07 DIAGNOSIS — Y92003 Bedroom of unspecified non-institutional (private) residence as the place of occurrence of the external cause: Secondary | ICD-10-CM | POA: Insufficient documentation

## 2017-03-07 HISTORY — DX: Unspecified dislocation of unspecified shoulder joint, initial encounter: S43.006A

## 2017-03-07 LAB — I-STAT BETA HCG BLOOD, ED (MC, WL, AP ONLY): I-stat hCG, quantitative: <5 m[IU]/mL (ref <5)

## 2017-03-07 MED ORDER — PROPOFOL 10 MG/ML IV BOLUS: 1.0000 mg/kg | Freq: Once | INTRAVENOUS | Status: DC

## 2017-03-07 MED ORDER — OXYCODONE-ACETAMINOPHEN 5-325 MG PO TABS
1.0000 | ORAL_TABLET | ORAL | Status: DC | PRN
  Administered 2017-03-07: 1 via ORAL

## 2017-03-07 MED ORDER — PROPOFOL 10 MG/ML IV BOLUS
INTRAVENOUS | Status: AC | PRN
  Administered 2017-03-07: 40 mg via INTRAVENOUS
  Administered 2017-03-07: 79.4 mg via INTRAVENOUS

## 2017-03-07 MED ORDER — FENTANYL CITRATE (PF) 100 MCG/2ML IJ SOLN
50.0000 ug | Freq: Once | INTRAMUSCULAR | Status: DC
  Filled 2017-03-07: qty 2

## 2017-03-07 MED ORDER — OXYCODONE-ACETAMINOPHEN 5-325 MG PO TABS
ORAL_TABLET | ORAL | Status: AC
  Filled 2017-03-07: qty 1

## 2017-03-07 MED ORDER — PROPOFOL 10 MG/ML IV BOLUS
1.0000 mg/kg | Freq: Once | INTRAVENOUS | Status: DC
  Filled 2017-03-07: qty 20

## 2017-03-07 MED ORDER — FENTANYL CITRATE (PF) 100 MCG/2ML IJ SOLN
INTRAMUSCULAR | Status: AC | PRN
  Administered 2017-03-07: 50 ug via INTRAVENOUS

## 2017-03-07 NOTE — ED Notes (Signed)
Shoulder immobilizer applied .

## 2017-03-07 NOTE — ED Provider Notes (Signed)
[facility]-EMERGENCY DEPT Provider Note   CSN: [id number] Arrival date & time: 03/07/17  1438     History   Chief Complaint Chief Complaint  Patient presents with  . Dislocation  . Shoulder Pain    HPI Sabrina Le is a 26 y.o. female presenting from urgent care with anterior right shoulder dislocation. Patient had an injury 2 years ago and has been dislocating her shoulder frequently with 2 emergency department visits in July 02/02/17 and 02/12/17. Patient reports that she was sleeping and lift her arm above her head and got stuck and experience extreme pain. She reports that in the past while dancing she sometimes dislocated her shoulder slightly but is able to reduce it on her own. She was referred to orthopedics twice but has not contacted them for follow-up. She denies any numbness, weakness or loss of sensation distally.  HPI  Past Medical History:  Diagnosis Date  . Bipolar disorder (HCC)   . Shoulder dislocation     There are no active problems to display for this patient.   History reviewed. No pertinent surgical history.  OB History    No data available       Home Medications    Prior to Admission medications   Medication Sig Start Date End Date Taking? Authorizing Provider  HYDROcodone-acetaminophen (NORCO) 5-325 MG tablet Take 1 tablet by mouth every 6 (six) hours as needed for severe pain. Patient not taking: Reported on 03/07/2017 02/13/17   Street, [place]Mercedes, PA-C  ibuprofen (ADVIL,MOTRIN) 600 MG tablet Take 1 tablet (600 mg total) by mouth every 6 (six) hours as needed. Patient not taking: Reported on 03/07/2017 02/02/17   Dietrich PatesKhatri, Hina, PA-C  naproxen (NAPROSYN) 500 MG tablet Take 1 tablet (500 mg total) by mouth 2 (two) times daily. Patient not taking: Reported on 03/07/2017 02/25/16   Dan HumphreysIrick, Michael, MD  naproxen (NAPROSYN) 500 MG tablet Take 1 tablet (500 mg total) by mouth 2 (two) times daily as needed for mild pain, moderate pain or headache (TAKE WITH  MEALS.). Patient not taking: Reported on 03/07/2017 02/13/17   Street, [place], PA-C  ondansetron (ZOFRAN) 4 MG tablet Take 1 tablet (4 mg total) by mouth every 8 (eight) hours as needed for nausea or vomiting. Patient not taking: Reported on 03/07/2017 02/02/17   Dietrich PatesKhatri, Hina, PA-C    Family History History reviewed. No pertinent family history.  Social History Social History  Substance Use Topics  . Smoking status: Current Every Day Smoker    Packs/day: 1.00  . Smokeless tobacco: Never Used  . Alcohol use Yes     Allergies   Patient has no known allergies.   Review of Systems Review of Systems  Respiratory: Negative for shortness of breath.   Cardiovascular: Negative for chest pain and palpitations.  Gastrointestinal: Negative for nausea and vomiting.  Musculoskeletal: Positive for arthralgias and myalgias.  Skin: Negative for color change and pallor.  Neurological: Negative for weakness and numbness.     Physical Exam Updated Vital Signs BP 109/63   Pulse (!) 52   Temp 98 F (36.7 C) (Oral)   Resp 15   Ht 5\' 7"  (1.702 m)   Wt 79.4 kg (175 lb)   LMP 02/14/2017 (Exact Date) Comment: shielded  SpO2 100%   BMI 27.41 kg/m   Physical Exam  Constitutional: She appears well-developed and well-nourished. No distress.  Afebrile, nontoxic-appearing, lying comfortably in bed in no acute distress.  HENT:  Head: Normocephalic and atraumatic.  Eyes: Conjunctivae and EOM are  normal.  Neck: Normal range of motion. Neck supple.  Cardiovascular: Normal rate, regular rhythm, normal heart sounds and intact distal pulses.   No murmur heard. Pulmonary/Chest: Effort normal and breath sounds normal. No respiratory distress. She has no wheezes. She has no rales. She exhibits no tenderness.  Abdominal: She exhibits no distension.  Musculoskeletal: She exhibits tenderness and deformity. She exhibits no edema.  Right shoulder with anterior deformity and tenderness. Patient is currently  in a sling.   Neurological: She is alert. No sensory deficit.  Strong grips bilaterally, sensation intact. Neurovascularly intact distally.  Skin: Skin is warm and dry. Capillary refill takes less than 2 seconds. No rash noted. She is not diaphoretic. No erythema. No pallor.  Psychiatric: She has a normal mood and affect.  Nursing note and vitals reviewed.    ED Treatments / Results  Labs (all labs ordered are listed, but only abnormal results are displayed) Labs Reviewed  I-STAT BETA HCG BLOOD, ED ([facility], [facility], [facility] ONLY)    EKG  EKG Interpretation None       Radiology Dg Shoulder Right  Result Date: 03/07/2017 CLINICAL DATA:  The patient reports right shoulder instability. She experienced onset of pain and limited range of motion after waking up 1.5 hours ago. EXAM: RIGHT SHOULDER - 2+ VIEW COMPARISON:  Plain films of the right shoulder 02/12/2017 and 02/13/2017. FINDINGS: The right shoulder is anteriorly dislocated. Hill-Sachs lesion is seen. The acromioclavicular joint is intact. IMPRESSION: Anterior right shoulder dislocation with an associated Hill-Sachs lesion. Electronically Signed   By: Drusilla Kanner M.D.   On: 03/07/2017 14:19   Dg Shoulder Right Portable  Result Date: 03/07/2017 CLINICAL DATA:  Status post reduction EXAM: PORTABLE RIGHT SHOULDER COMPARISON:  Films from earlier in the same day. FINDINGS: The humeral head is been relocated into the glenoid. No acute fracture is noted. The underlying bony thorax is unremarkable. IMPRESSION: Status post reduction of humeral head. Electronically Signed   By: Alcide Clever M.D.   On: 03/07/2017 21:21    Procedures .Sedation Date/Time: 03/07/2017 8:46 PM Performed by: Mathews Robinsons B Authorized by: Mathews Robinsons B   Consent:    Consent obtained:  Verbal and written   Consent given by:  Patient   Risks discussed:  Allergic reaction, dysrhythmia, respiratory compromise necessitating ventilatory assistance and intubation  and inadequate sedation   Alternatives discussed:  Analgesia without sedation Indications:    Procedure performed:  Dislocation reduction   Procedure necessitating sedation performed by:  Physician performing sedation   Intended level of sedation:  Moderate (conscious sedation) Pre-sedation assessment:    NPO status caution comment:  8 hours   Neck mobility: normal     Mouth opening:  3 or more finger widths   Thyromental distance:  3 finger widths   Mallampati score:  I - soft palate, uvula, fauces, pillars visible   History of difficult intubation: no   Immediate pre-procedure details:    Reviewed: vital signs, relevant labs/tests and NPO status     Verified: bag valve mask available, emergency equipment available, intubation equipment available, IV patency confirmed, oxygen available, reversal medications available and suction available   Procedure details (see MAR for exact dosages):    Preoxygenation:  Nasal cannula   Sedation:  Propofol   Analgesia:  Fentanyl   Intra-procedure monitoring:  Blood pressure monitoring, cardiac monitor, continuous pulse oximetry and frequent vital sign checks   Intra-procedure events: none   Post-procedure details:    Estimated blood loss (see  I/O flowsheets): no     Post-sedation assessments completed and reviewed: nausea/vomiting not reviewed     Specimens recovered:  None   Patient is stable for discharge or admission: yes     Patient tolerance:  Tolerated well, no immediate complications    (including critical care time)  Reduction of dislocation Date/Time: 10:09 PM Performed by: Georgiana Shore Authorized by: Georgiana Shore Consent: Verbal consent obtained. Risks and benefits: risks, benefits and alternatives were discussed Consent given by: patient Required items: required blood products, implants, devices, and special equipment available Time out: Immediately prior to procedure a "time out" was called to verify the correct  patient, procedure, equipment, support staff and site/side marked as required.  Patient sedated: yes  Vitals: Vital signs were monitored during sedation. Patient tolerance: Patient tolerated the procedure well with no immediate complications. Joint: right shoulder Reduction technique: traction, counter-traction   Medications Ordered in ED Medications  oxyCODONE-acetaminophen (PERCOCET/ROXICET) 5-325 MG per tablet 1 tablet (1 tablet Oral Given 03/07/17 1527)  oxyCODONE-acetaminophen (PERCOCET/ROXICET) 5-325 MG per tablet (not administered)  fentaNYL (SUBLIMAZE) injection 50 mcg (not administered)  propofol (DIPRIVAN) 10 mg/mL bolus/IV push 79.4 mg (not administered)  fentaNYL (SUBLIMAZE) injection (50 mcg Intravenous Given 03/07/17 2035)  propofol (DIPRIVAN) 10 mg/mL bolus/IV push (40 mg Intravenous Given 03/07/17 2039)     Initial Impression / Assessment and Plan / ED Course  I have reviewed the triage vital signs and the nursing notes.  Pertinent labs & imaging results that were available during my care of the patient were reviewed by me and considered in my medical decision making (see chart for details).     Patient presents with anterior right shoulder dislocation. Shoulder was reduced with attending Dr. Ethelda Chick without complications and patient tolerated procedure well.  Reduction confirmed on xray. Patient was back to baseline with normal vitals signs and stable prior to discharge.  Patient was given post sedation instructions and questions answered. Patient is accompanied by roommate who will be staying with her tonight. Urged patient to follow up with ortho in the morning. Patient understood and will be following up.  Discussed strict return precautions and advised to return to the emergency department if experiencing any new or worsening symptoms. Instructions were understood and patient agreed with discharge plan.  Final Clinical Impressions(s) / ED Diagnoses   Final  diagnoses:  Dislocation of right shoulder joint, initial encounter  Hill Sachs deformity, right    New Prescriptions New Prescriptions   No medications on file     Gregary Cromer 03/07/17 2211    Doug Sou, MD 03/08/17 [phone]

## 2017-03-07 NOTE — ED Provider Notes (Signed)
Patient spontaneously dislocated her right shoulder while asleep.. Shoulder was reduced and procedural sedation performed using propofol with me and Ms Mitcheell. Timeout performed. Time of Timeout was 2030 1 PM procedure completed at 2040 3 PM at 2100 patient is fully alert awake Glasgow coma score 15. She has no pain. X-rays viewed by me   Doug SouJacubowitz, Navi Ewton, MD 03/07/17 2104

## 2017-03-07 NOTE — Discharge Instructions (Signed)
As discussed, follow up with orthopedics tomorrow to schedule an office appointment.  Get rest, drink plenty of fluids, avoid alcohol, do not drive or operate machinery for the next 24 hours. Make sure that someone responsible is with you overnight.  Return if you experience difficulty breathing, nausea, vomiting or other concerning symptoms in the meantime.

## 2017-03-07 NOTE — ED Notes (Signed)
Patient signed consent form for her right shoulder dislocation reduction under conscious sedation . Airway cart/ambubag at bedside . Pt. placed on a monitor .

## 2017-03-07 NOTE — ED Triage Notes (Signed)
Pt endorses right shoulder dislocation. [facility]. Pt has hx of same x 3 and supposed to follow up with ortho but has not. VSS.

## 2017-03-07 NOTE — Discharge Instructions (Signed)
Discharge to the ED 

## 2017-03-07 NOTE — ED Notes (Signed)
[facility] confirmed on [facility] today

## 2017-03-07 NOTE — ED Notes (Signed)
Jessica (PA) at bedside.

## 2017-03-07 NOTE — ED Provider Notes (Signed)
[facility]    CSN: [id number] Arrival date & time: 03/07/17  1216     History   Chief Complaint Chief Complaint  Patient presents with  . Shoulder Injury    HPI Sabrina Le is a 26 y.o. female.   HPI  Patient presenting with shoulder pain. States that she has had several shoulder dislocations. Most recently was seen in July on the 13th and the 23rd for shoulder dislocations. She states about 2 years ago she fell on the treadmill and dislocated her shoulder and since then she's had multiple shoulder dislocations. She denies any trauma associated with this event. She states that she was asleep and thinks that she dislocated her shoulder in her sleep and woke up in severe pain. She states that she called EMS and they indicated that she would need to come to the urgent care for further workup.  Past Medical History:  Diagnosis Date  . Bipolar disorder (HCC)     There are no active problems to display for this patient.   History reviewed. No pertinent surgical history.  OB History    No data available       Home Medications    Prior to Admission medications   Medication Sig Start Date End Date Taking? Authorizing Provider  HYDROcodone-acetaminophen (NORCO) 5-325 MG tablet Take 1 tablet by mouth every 6 (six) hours as needed for severe pain. 02/13/17   Street, HowardMercedes, PA-C  ibuprofen (ADVIL,MOTRIN) 600 MG tablet Take 1 tablet (600 mg total) by mouth every 6 (six) hours as needed. Patient not taking: Reported on 02/13/2017 02/02/17   Dietrich PatesKhatri, Hina, PA-C  naproxen (NAPROSYN) 500 MG tablet Take 1 tablet (500 mg total) by mouth 2 (two) times daily. Patient not taking: Reported on 02/13/2017 02/25/16   Dan HumphreysIrick, Michael, MD  naproxen (NAPROSYN) 500 MG tablet Take 1 tablet (500 mg total) by mouth 2 (two) times daily as needed for mild pain, moderate pain or headache (TAKE WITH MEALS.). 02/13/17   Street, Mercedes, PA-C  ondansetron (ZOFRAN) 4 MG tablet Take 1 tablet (4 mg  total) by mouth every 8 (eight) hours as needed for nausea or vomiting. Patient not taking: Reported on 02/13/2017 02/02/17   Dietrich PatesKhatri, Hina, PA-C    Family History No family history on file.  Social History Social History  Substance Use Topics  . Smoking status: Current Every Day Smoker    Packs/day: 1.00  . Smokeless tobacco: Never Used  . Alcohol use Yes     Allergies   Patient has no known allergies.   Review of Systems Review of Systems  Constitutional: Negative for fatigue and fever.  HENT: Negative for congestion.   Eyes: Negative for discharge and itching.  Respiratory: Negative for cough and shortness of breath.   Cardiovascular: Negative for chest pain and leg swelling.  Gastrointestinal: Negative for abdominal distention and abdominal pain.     Physical Exam Triage Vital Signs ED Triage Vitals [03/07/17 1343]  Enc Vitals Group     BP 112/75     Pulse Rate 63     Resp 16     Temp 98.5 F (36.9 C)     Temp Source Oral     SpO2 97 %     Weight 175 lb (79.4 kg)     Height 5\' 7"  (1.702 m)     Head Circumference      Peak Flow      Pain Score 10     Pain Loc  Pain Edu?      Excl. in GC?    No data found.   Updated Vital Signs BP 112/75   Pulse 63   Temp 98.5 F (36.9 C) (Oral)   Resp 16   Ht 5\' 7"  (1.702 m)   Wt 175 lb (79.4 kg)   LMP 02/14/2017   SpO2 97%   BMI 27.41 kg/m   Physical Exam  Constitutional: She is oriented to person, place, and time. She appears well-developed and well-nourished.  HENT:  Head: Normocephalic and atraumatic.  Eyes: Pupils are equal, round, and reactive to light. Conjunctivae are normal.  Neck: Normal range of motion. Neck supple.  Cardiovascular: Normal rate, regular rhythm and normal heart sounds.   Pulmonary/Chest: Effort normal and breath sounds normal.  Abdominal: Soft. Bowel sounds are normal.  Musculoskeletal: Normal range of motion.  Patient with anterior positioned shoulder, pain with palpation of  shoulder joint, range of motion impaired by pain, radial pulses in 2+  Neurological: She is alert and oriented to person, place, and time.     UC Treatments / Results  Labs (all labs ordered are listed, but only abnormal results are displayed) Labs Reviewed - No data to display  EKG  EKG Interpretation None       Radiology Dg Shoulder Right  Result Date: 03/07/2017 CLINICAL DATA:  The patient reports right shoulder instability. She experienced onset of pain and limited range of motion after waking up 1.5 hours ago. EXAM: RIGHT SHOULDER - 2+ VIEW COMPARISON:  Plain films of the right shoulder 02/12/2017 and 02/13/2017. FINDINGS: The right shoulder is anteriorly dislocated. Hill-Sachs lesion is seen. The acromioclavicular joint is intact. IMPRESSION: Anterior right shoulder dislocation with an associated Hill-Sachs lesion. Electronically Signed   By: Drusilla Kanner M.D.   On: 03/07/2017 14:19    Procedures Procedures (including critical care time)  Medications Ordered in UC Medications - No data to display   Initial Impression / Assessment and Plan / UC Course  I have reviewed the triage vital signs and the nursing notes.  Pertinent labs & imaging results that were available during my care of the patient were reviewed by me and considered in my medical decision making (see chart for details).     Patient presenting with anterior right shoulder dislocation. X-ray was obtained patient was advised to go to the ED for relocation of shoulder.   Final Clinical Impressions(s) / UC Diagnoses   Final diagnoses:  Shoulder dislocation, right, subsequent encounter    New Prescriptions Discharge Medication List as of 03/07/2017  2:21 PM         Berton Bon, MD 03/07/17 1445

## 2017-03-07 NOTE — ED Notes (Signed)
[facility] nurse first given report and made aware that PT's shoulder is out of place.

## 2017-03-07 NOTE — ED Triage Notes (Signed)
PT reports her right shoulder pops out of joint while she sleeps. She woke up to it 1.5 hours ago.

## 2017-03-07 NOTE — ED Notes (Signed)
X-ray at bedside

## 2017-03-11 ENCOUNTER — Emergency Department (HOSPITAL_COMMUNITY)
Admission: EM | Admit: 2017-03-11 | Discharge: 2017-03-11 | Disposition: A | Discharge status: Home / Self Care | Payer: Self-pay | Attending: Emergency Medicine | Admitting: Emergency Medicine

## 2017-03-11 ENCOUNTER — Emergency Department (HOSPITAL_COMMUNITY): Payer: Self-pay

## 2017-03-11 ENCOUNTER — Encounter (HOSPITAL_COMMUNITY): Payer: Self-pay | Admitting: Emergency Medicine

## 2017-03-11 DIAGNOSIS — Y33XXXA Other specified events, undetermined intent, initial encounter: Secondary | ICD-10-CM | POA: Insufficient documentation

## 2017-03-11 DIAGNOSIS — F17210 Nicotine dependence, cigarettes, uncomplicated: Secondary | ICD-10-CM | POA: Insufficient documentation

## 2017-03-11 DIAGNOSIS — Y9389 Activity, other specified: Secondary | ICD-10-CM | POA: Insufficient documentation

## 2017-03-11 DIAGNOSIS — Y998 Other external cause status: Secondary | ICD-10-CM | POA: Insufficient documentation

## 2017-03-11 DIAGNOSIS — M24411 Recurrent dislocation, right shoulder: Secondary | ICD-10-CM | POA: Insufficient documentation

## 2017-03-11 DIAGNOSIS — Y92003 Bedroom of unspecified non-institutional (private) residence as the place of occurrence of the external cause: Secondary | ICD-10-CM | POA: Insufficient documentation

## 2017-03-11 MED ORDER — PROPOFOL 10 MG/ML IV BOLUS
1.0000 mg/kg | Freq: Once | INTRAVENOUS | Status: AC
  Administered 2017-03-11: 30 mg via INTRAVENOUS
  Filled 2017-03-11: qty 20

## 2017-03-11 MED ORDER — ONDANSETRON HCL 4 MG/2ML IJ SOLN
4.0000 mg | Freq: Once | INTRAMUSCULAR | Status: AC
  Administered 2017-03-11: 4 mg via INTRAVENOUS
  Filled 2017-03-11: qty 2

## 2017-03-11 MED ORDER — LIDOCAINE-EPINEPHRINE (PF) 2 %-1:200000 IJ SOLN
20.0000 mL | Freq: Once | INTRAMUSCULAR | Status: AC
  Administered 2017-03-11: 10 mL via INTRADERMAL
  Filled 2017-03-11: qty 20

## 2017-03-11 MED ORDER — FENTANYL CITRATE (PF) 100 MCG/2ML IJ SOLN
100.0000 ug | Freq: Once | INTRAMUSCULAR | Status: AC
  Administered 2017-03-11: 100 ug via INTRAVENOUS
  Filled 2017-03-11: qty 2

## 2017-03-11 NOTE — Discharge Instructions (Signed)
Follow up with an orthopedist.   Take 4 over the counter ibuprofen tablets 3 times a day or 2 over-the-counter naproxen tablets twice a day for pain. Also take tylenol 1000mg (2 extra strength) four times a day.

## 2017-03-11 NOTE — ED Provider Notes (Signed)
[facility]   CSN: [id number] Arrival date & time: 03/11/17  4098     History   Chief Complaint Chief Complaint  Patient presents with  . Shoulder dislocation    HPI Sabrina Le is a 26 y.o. female.  26 yo F with a cc of a R shoulder dislocation.  Has had multiple of these.  Was taking her arm out of her sling in bed and felt it dislocate.  Denies trauma.    The history is provided by the patient.  Shoulder Pain   This is a new problem. The current episode started less than 1 hour ago. The problem occurs constantly. The problem has not changed since onset.The pain is present in the right shoulder. The quality of the pain is described as dull. The pain is at a severity of 10/10. The pain is severe. She has tried nothing for the symptoms. The treatment provided no relief. There has been no history of extremity trauma.    Past Medical History:  Diagnosis Date  . Bipolar disorder (HCC)   . Shoulder dislocation     There are no active problems to display for this patient.   History reviewed. No pertinent surgical history.  OB History    No data available       Home Medications    Prior to Admission medications   Medication Sig Start Date End Date Taking? Authorizing Provider  HYDROcodone-acetaminophen (NORCO) 5-325 MG tablet Take 1 tablet by mouth every 6 (six) hours as needed for severe pain. Patient not taking: Reported on 03/07/2017 02/13/17   Street, [place], PA-C  ibuprofen (ADVIL,MOTRIN) 600 MG tablet Take 1 tablet (600 mg total) by mouth every 6 (six) hours as needed. Patient not taking: Reported on 03/07/2017 02/02/17   Dietrich Pates, PA-C  naproxen (NAPROSYN) 500 MG tablet Take 1 tablet (500 mg total) by mouth 2 (two) times daily. Patient not taking: Reported on 03/07/2017 02/25/16   Baldomero Mirarchi Humphreys, MD  naproxen (NAPROSYN) 500 MG tablet Take 1 tablet (500 mg total) by mouth 2 (two) times daily as needed for mild pain, moderate pain or headache  (TAKE WITH MEALS.). Patient not taking: Reported on 03/07/2017 02/13/17   Street, [place], PA-C  ondansetron (ZOFRAN) 4 MG tablet Take 1 tablet (4 mg total) by mouth every 8 (eight) hours as needed for nausea or vomiting. Patient not taking: Reported on 03/07/2017 02/02/17   Dietrich Pates, PA-C    Family History No family history on file.  Social History Social History  Substance Use Topics  . Smoking status: Current Every Day Smoker    Packs/day: 1.00  . Smokeless tobacco: Never Used  . Alcohol use Yes     Allergies   Patient has no known allergies.   Review of Systems Review of Systems  Constitutional: Negative for chills and fever.  HENT: Negative for congestion and rhinorrhea.   Eyes: Negative for redness and visual disturbance.  Respiratory: Negative for shortness of breath and wheezing.   Cardiovascular: Negative for chest pain and palpitations.  Gastrointestinal: Negative for nausea and vomiting.  Genitourinary: Negative for dysuria and urgency.  Musculoskeletal: Positive for arthralgias and myalgias.  Skin: Negative for pallor and wound.  Neurological: Negative for dizziness and headaches.     Physical Exam Updated Vital Signs BP (!) 119/58   Pulse (!) 54   Temp 97.7 F (36.5 C) (Oral)   Resp 13   LMP 02/14/2017 (Exact Date) Comment: shielded  SpO2 98%   Physical  Exam  Constitutional: She is oriented to person, place, and time. She appears well-developed and well-nourished. No distress.  HENT:  Head: Normocephalic and atraumatic.  Eyes: Pupils are equal, round, and reactive to light. EOM are normal.  Neck: Normal range of motion. Neck supple.  Cardiovascular: Normal rate and regular rhythm.  Exam reveals no gallop and no friction rub.   No murmur heard. Pulmonary/Chest: Effort normal. She has no wheezes. She has no rales.  Abdominal: Soft. She exhibits no distension. There is no tenderness.  Musculoskeletal: She exhibits edema, tenderness and deformity.    Deformity and pain to R shoulder.  Intact PMS distally.  ? Decreased sensation over the deltoid.   Neurological: She is alert and oriented to person, place, and time.  Skin: Skin is warm and dry. She is not diaphoretic.  Psychiatric: She has a normal mood and affect. Her behavior is normal.  Nursing note and vitals reviewed.    ED Treatments / Results  Labs (all labs ordered are listed, but only abnormal results are displayed) Labs Reviewed - No data to display  EKG  EKG Interpretation None       Radiology Dg Shoulder Right  Result Date: 03/11/2017 CLINICAL DATA:  Right shoulder dislocation, acute onset. Initial encounter. EXAM: RIGHT SHOULDER - 2+ VIEW COMPARISON:  Right shoulder radiographs performed 03/07/2017 FINDINGS: There is anterior-inferior dislocation of the right humeral head. No definite osseous [place] or Hill-Sachs lesion is seen. The right acromioclavicular joint is unremarkable. No definite soft tissue abnormalities are characterized on radiograph. The right lung appears clear. IMPRESSION: Anterior-inferior dislocation of the right humeral head. No osseous [place] or Hill-Sachs lesion seen. Electronically Signed   By: Roanna Raider M.D.   On: 03/11/2017 05:46    Procedures .Joint Aspiration/Arthrocentesis Date/Time: 03/11/2017 6:11 AM Performed by: Melene Plan Authorized by: Melene Plan   Consent:    Consent obtained:  Verbal   Consent given by:  Patient   Risks discussed:  Bleeding and infection Location:    Location:  Shoulder   Shoulder:  R glenohumeral Anesthesia (see MAR for exact dosages):    Anesthesia method:  Local infiltration   Local anesthetic:  Lidocaine 1% WITH epi Procedure details:    Preparation: Patient was prepped and draped in usual sterile fashion     Needle gauge:  22 G   Ultrasound guidance: no     Approach:  Lateral   Aspirate amount:  0   Aspirate characteristics:  Bloody   Steroid injected: no     Specimen collected: no    Post-procedure details:    Dressing:  Adhesive bandage   Patient tolerance of procedure:  Tolerated well, no immediate complications Reduction of dislocation Date/Time: 03/11/2017 6:12 AM Performed by: Adela Lank Jaydin Jalomo Authorized by: Melene Plan  Consent: Verbal consent obtained. Risks and benefits: risks, benefits and alternatives were discussed Consent given by: patient Patient understanding: patient states understanding of the procedure being performed Patient consent: the patient's understanding of the procedure matches consent given Procedure consent: procedure consent matches procedure scheduled Relevant documents: relevant documents present and verified Required items: required blood products, implants, devices, and special equipment available Patient identity confirmed: verbally with patient Time out: Immediately prior to procedure a "time out" was called to verify the correct patient, procedure, equipment, support staff and site/side marked as required. Preparation: Patient was prepped and draped in the usual sterile fashion. Local anesthesia used: yes Anesthesia: local infiltration  Anesthesia: Local anesthesia used: yes Local Anesthetic: lidocaine 1% with  epinephrine Anesthetic total: 8 mL  Sedation: Patient sedated: yes Sedation type: moderate (conscious) sedation Sedatives: propofol Vitals: Vital signs were monitored during sedation. Patient tolerance: Patient tolerated the procedure well with no immediate complications Comments: Reduced with park maneuver.   .Sedation Date/Time: 03/11/2017 6:13 AM Performed by: Melene Plan Authorized by: Melene Plan   Consent:    Consent obtained:  Verbal   Consent given by:  Patient   Risks discussed:  Allergic reaction, prolonged hypoxia resulting in organ damage, prolonged sedation necessitating reversal, vomiting and nausea   Alternatives discussed:  Analgesia without sedation and regional anesthesia Indications:    Procedure  performed:  Dislocation reduction   Procedure necessitating sedation performed by:  Physician performing sedation   Intended level of sedation:  Moderate (conscious sedation) Pre-sedation assessment:    Time since last food or drink:  8hrs Immediate pre-procedure details:    Reassessment: Patient reassessed immediately prior to procedure     Reviewed: vital signs, relevant labs/tests and NPO status     Verified: bag valve mask available, emergency equipment available, intubation equipment available, IV patency confirmed, oxygen available and suction available   Procedure details (see MAR for exact dosages):    Sedation start time:  03/11/2017 5:35 AM   Preoxygenation:  Nasal cannula   Sedation:  Propofol   Analgesia:  Fentanyl   Intra-procedure monitoring:  Blood pressure monitoring, cardiac monitor, continuous capnometry, continuous pulse oximetry, frequent LOC assessments and frequent vital sign checks   Intra-procedure events: none     Sedation end time:  03/11/2017 5:50 AM   Total sedation time (minutes):  25 Post-procedure details:    Post-sedation assessments completed and reviewed: airway patency, cardiovascular function, hydration status, mental status, nausea/vomiting, pain level and temperature     Patient is stable for discharge or admission: yes     Patient tolerance:  Tolerated well, no immediate complications   (including critical care time)  Medications Ordered in ED Medications  ondansetron (ZOFRAN) injection 4 mg (not administered)  propofol (DIPRIVAN) 10 mg/mL bolus/IV push 79.4 mg (30 mg Intravenous Given 03/11/17 0556)  lidocaine-EPINEPHrine (XYLOCAINE W/EPI) 2 %-1:200000 (PF) injection 20 mL (10 mLs Intradermal Given by Other 03/11/17 0545)  fentaNYL (SUBLIMAZE) injection 100 mcg (100 mcg Intravenous Given 03/11/17 0550)     Initial Impression / Assessment and Plan / ED Course  I have reviewed the triage vital signs and the nursing notes.  Pertinent labs & imaging  results that were available during my care of the patient were reviewed by me and considered in my medical decision making (see chart for details).     26 yo F with a cc of right shoulder dislocation.  She has had multiple issues with this.  Stressed need to follow up with ortho as she has not yet done this.   6:57 AM:  I have discussed the diagnosis/risks/treatment options with the patient and family and believe the pt to be eligible for discharge home to follow-up with ortho. We also discussed returning to the ED immediately if new or worsening sx occur. We discussed the sx which are most concerning (e.g., sudden worsening pain, fever, inability to tolerate by mouth) that necessitate immediate return. Medications administered to the patient during their visit and any new prescriptions provided to the patient are listed below.  Medications given during this visit Medications  ondansetron (ZOFRAN) injection 4 mg (not administered)  propofol (DIPRIVAN) 10 mg/mL bolus/IV push 79.4 mg (30 mg Intravenous Given 03/11/17 0556)  lidocaine-EPINEPHrine (XYLOCAINE  W/EPI) 2 %-1:200000 (PF) injection 20 mL (10 mLs Intradermal Given by Other 03/11/17 0545)  fentaNYL (SUBLIMAZE) injection 100 mcg (100 mcg Intravenous Given 03/11/17 0550)     The patient appears reasonably screen and/or stabilized for discharge and I doubt any other medical condition or other [facility] requiring further screening, evaluation, or treatment in the ED at this time prior to discharge.      Final Clinical Impressions(s) / ED Diagnoses   Final diagnoses:  Shoulder dislocation, recurrent, right    New Prescriptions New Prescriptions   No medications on file     Melene Plan, DO 03/11/17 1610

## 2017-03-11 NOTE — Sedation Documentation (Signed)
Pre procedure check list done at [phone]

## 2017-03-11 NOTE — ED Triage Notes (Signed)
Per EMS pt has history of multiple dislocations. Was putting on her sling immobilizer for sleep around 4 and felt the shoulder dislocate again.  Pt has had a total of 200 mcg Fentnyl en route last being 50 on arrival to the ED.

## 2017-07-07 ENCOUNTER — Emergency Department (HOSPITAL_COMMUNITY)
Admission: EM | Admit: 2017-07-07 | Discharge: 2017-07-07 | Disposition: A | Discharge status: Home / Self Care | Payer: Self-pay | Attending: Emergency Medicine | Admitting: Emergency Medicine

## 2017-07-07 ENCOUNTER — Encounter (HOSPITAL_COMMUNITY): Payer: Self-pay

## 2017-07-07 DIAGNOSIS — F17200 Nicotine dependence, unspecified, uncomplicated: Secondary | ICD-10-CM | POA: Insufficient documentation

## 2017-07-07 DIAGNOSIS — X58XXXA Exposure to other specified factors, initial encounter: Secondary | ICD-10-CM | POA: Insufficient documentation

## 2017-07-07 DIAGNOSIS — Y999 Unspecified external cause status: Secondary | ICD-10-CM | POA: Insufficient documentation

## 2017-07-07 DIAGNOSIS — S43004A Unspecified dislocation of right shoulder joint, initial encounter: Secondary | ICD-10-CM | POA: Insufficient documentation

## 2017-07-07 DIAGNOSIS — Y929 Unspecified place or not applicable: Secondary | ICD-10-CM | POA: Insufficient documentation

## 2017-07-07 DIAGNOSIS — Y9384 Activity, sleeping: Secondary | ICD-10-CM | POA: Insufficient documentation

## 2017-07-07 NOTE — ED Notes (Signed)
Bed: [id number] Expected date:  Expected time:  Means of arrival:  Comments: 26 yo shoulder injury

## 2017-07-07 NOTE — ED Provider Notes (Signed)
[facility] Provider Note   CSN: [id number] Arrival date & time: 07/07/17  [id number]     History   Chief Complaint Chief Complaint  Patient presents with  . Shoulder Pain    HPI Sabrina Le is a 26 y.o. female.  HPI Patient with multiple previous dislocations of her right shoulder presents after dislocation while sleeping.  States that she called EMS.  Was given pain medicine in route.  Patient states that the shoulder spontaneously reduced.  She now has no pain.  Full range of motion.  Denies any sensory changes.  States she has a shoulder sling and immobilizer at home. Past Medical History:  Diagnosis Date  . Bipolar disorder (HCC)   . Shoulder dislocation     There are no active problems to display for this patient.   History reviewed. No pertinent surgical history.  OB History    No data available       Home Medications    Prior to Admission medications   Medication Sig Start Date End Date Taking? Authorizing Provider  HYDROcodone-acetaminophen (NORCO) 5-325 MG tablet Take 1 tablet by mouth every 6 (six) hours as needed for severe pain. Patient not taking: Reported on 03/07/2017 02/13/17   Street, [place], PA-C  ibuprofen (ADVIL,MOTRIN) 600 MG tablet Take 1 tablet (600 mg total) by mouth every 6 (six) hours as needed. Patient not taking: Reported on 03/07/2017 02/02/17   Dietrich PatesKhatri, Hina, PA-C  naproxen (NAPROSYN) 500 MG tablet Take 1 tablet (500 mg total) by mouth 2 (two) times daily. Patient not taking: Reported on 03/07/2017 02/25/16   Dan HumphreysIrick, Michael, MD  naproxen (NAPROSYN) 500 MG tablet Take 1 tablet (500 mg total) by mouth 2 (two) times daily as needed for mild pain, moderate pain or headache (TAKE WITH MEALS.). Patient not taking: Reported on 03/07/2017 02/13/17   Street, Melf[place]Mercedes, PA-C  ondansetron (ZOFRAN) 4 MG tablet Take 1 tablet (4 mg total) by mouth every 8 (eight) hours as needed for nausea or vomiting. Patient not taking:  Reported on 03/07/2017 02/02/17   Dietrich PatesKhatri, Hina, PA-C    Family History History reviewed. No pertinent family history.  Social History Social History   Tobacco Use  . Smoking status: Current Every Day Smoker    Packs/day: 1.00  . Smokeless tobacco: Never Used  Substance Use Topics  . Alcohol use: Yes  . Drug use: Yes    Types: Marijuana     Allergies   Patient has no known allergies.   Review of Systems Review of Systems  Constitutional: Negative for chills and fever.  Musculoskeletal: Positive for arthralgias. Negative for myalgias.  Skin: Negative for rash and wound.  Neurological: Negative for weakness and numbness.  All other systems reviewed and are negative.    Physical Exam Updated Vital Signs BP 111/66 (BP Location: Left Arm)   Pulse (!) 57   Temp (!) 97.4 F (36.3 C) (Oral)   Resp 16   Ht 5' 7.5" (1.715 m)   Wt 81.6 kg (180 lb)   LMP 06/26/2017   SpO2 100%   BMI 27.78 kg/m   Physical Exam  Constitutional: She is oriented to person, place, and time. She appears well-developed and well-nourished. No distress.  HENT:  Head: Normocephalic and atraumatic.  Mouth/Throat: Oropharynx is clear and moist.  Eyes: EOM are normal. Pupils are equal, round, and reactive to light.  Neck: Normal range of motion. Neck supple.  Cardiovascular: Normal rate and regular rhythm.  Pulmonary/Chest: Effort normal.  Abdominal: Soft.  Musculoskeletal: Normal range of motion. She exhibits no edema, tenderness or deformity.  Patient with full range of motion of the right shoulder.  No tenderness to palpation.  No effusion.  No step-offs.  Distal pulses are 2+.  Neurological: She is alert and oriented to person, place, and time.  Grip strength bilaterally intact.  No sensory deficit especially over the deltoid.  Skin: Skin is warm and dry. Capillary refill takes less than 2 seconds. No rash noted. She is not diaphoretic. No erythema.  Psychiatric: She has a normal mood and  affect. Her behavior is normal.  Nursing note and vitals reviewed.    ED Treatments / Results  Labs (all labs ordered are listed, but only abnormal results are displayed) Labs Reviewed - No data to display  EKG  EKG Interpretation None       Radiology No results found.  Procedures Procedures (including critical care time)  Medications Ordered in ED Medications - No data to display   Initial Impression / Assessment and Plan / ED Course  I have reviewed the triage vital signs and the nursing notes.  Pertinent labs & imaging results that were available during my care of the patient were reviewed by me and considered in my medical decision making (see chart for details).     Patient with no complaints.  Neurovascularly intact.  Advised to follow-up closely with orthopedist.  Return precautions given.  Final Clinical Impressions(s) / ED Diagnoses   Final diagnoses:  Closed dislocation of right shoulder, initial encounter    ED Discharge Orders    None       Loren RacerYelverton, Jordell Outten, MD 07/07/17 [phone]

## 2017-07-07 NOTE — ED Triage Notes (Signed)
Per EMS, pt has hx of shoulder dislocation.  Pt shoulder came out in sleep last night.  Pt able to put rt shoulder back in on the way to hospital with pain relief.  Pain continues.  Fentanyl in route.  IV 20g in place.  Vitals:  125/80, hr 66, resp 18, 100% ra

## 2017-07-19 ENCOUNTER — Emergency Department (HOSPITAL_COMMUNITY)
Admission: EM | Admit: 2017-07-19 | Discharge: 2017-07-19 | Disposition: A | Discharge status: Home / Self Care | Payer: No Typology Code available for payment source | Attending: Emergency Medicine | Admitting: Emergency Medicine

## 2017-07-19 ENCOUNTER — Encounter (HOSPITAL_COMMUNITY): Payer: Self-pay | Admitting: Emergency Medicine

## 2017-07-19 DIAGNOSIS — Y9389 Activity, other specified: Secondary | ICD-10-CM | POA: Insufficient documentation

## 2017-07-19 DIAGNOSIS — S3992XA Unspecified injury of lower back, initial encounter: Secondary | ICD-10-CM | POA: Diagnosis present

## 2017-07-19 DIAGNOSIS — Y999 Unspecified external cause status: Secondary | ICD-10-CM | POA: Diagnosis not present

## 2017-07-19 DIAGNOSIS — Z79899 Other long term (current) drug therapy: Secondary | ICD-10-CM | POA: Diagnosis not present

## 2017-07-19 DIAGNOSIS — F17210 Nicotine dependence, cigarettes, uncomplicated: Secondary | ICD-10-CM | POA: Insufficient documentation

## 2017-07-19 DIAGNOSIS — K0889 Other specified disorders of teeth and supporting structures: Secondary | ICD-10-CM

## 2017-07-19 DIAGNOSIS — Y92410 Unspecified street and highway as the place of occurrence of the external cause: Secondary | ICD-10-CM | POA: Insufficient documentation

## 2017-07-19 DIAGNOSIS — M5489 Other dorsalgia: Secondary | ICD-10-CM | POA: Diagnosis not present

## 2017-07-19 DIAGNOSIS — M549 Dorsalgia, unspecified: Secondary | ICD-10-CM

## 2017-07-19 DIAGNOSIS — G8911 Acute pain due to trauma: Secondary | ICD-10-CM | POA: Diagnosis not present

## 2017-07-19 DIAGNOSIS — G8929 Other chronic pain: Secondary | ICD-10-CM

## 2017-07-19 DIAGNOSIS — K089 Disorder of teeth and supporting structures, unspecified: Secondary | ICD-10-CM | POA: Insufficient documentation

## 2017-07-19 DIAGNOSIS — M25511 Pain in right shoulder: Secondary | ICD-10-CM | POA: Insufficient documentation

## 2017-07-19 MED ORDER — NAPROXEN 375 MG PO TABS: 375.0000 mg | ORAL_TABLET | Freq: Two times a day (BID) | ORAL | 0 refills | Status: DC

## 2017-07-19 MED ORDER — AMOXICILLIN 500 MG PO CAPS: 500.0000 mg | ORAL_CAPSULE | Freq: Three times a day (TID) | ORAL | 0 refills | Status: DC

## 2017-07-19 MED ORDER — CYCLOBENZAPRINE HCL 10 MG PO TABS: 10.0000 mg | ORAL_TABLET | Freq: Two times a day (BID) | ORAL | 0 refills | Status: DC | PRN

## 2017-07-19 NOTE — Discharge Instructions (Signed)
This is likely musculoskeletal pain. Please take the Naproxen as prescribed for pain. Do not take any additional NSAIDs including Motrin, Aleve, Ibuprofen, Advil. Please the the Flexeril for muscle relaxation. This medication will make you drowsy so avoid situation that could place you in danger. Please rest, ice, compress and elevated the affected body part to help with swelling and pain.  She eating a warm compress to your back.  Warm soaks and Epsom salt.  You have a dental infection. It is very important that you get evaluated by a dentist as soon as possible. Call tomorrow to schedule an appointment. Naproxen tylenol as needed for pain.  May use over-the-counter Orajel to help with pain.  Take your full course of antibiotics. Read the instructions below.  Eat a soft or liquid diet and rinse your mouth out after meals with warm water. You should see a dentist or return here at once if you have increased swelling, increased pain or uncontrolled bleeding from the site of your injury.  SEEK MEDICAL CARE IF:  You have increased pain not controlled with medicines.  You have swelling around your tooth, in your face or neck.  You have bleeding which starts, continues, or gets worse.  You have a fever >101 If you are unable to open your mouth

## 2017-07-19 NOTE — ED Notes (Signed)
Pt ambulatory and independent at discharge.  Verbalized understanding of discharge instructions 
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
## 2017-09-23 ENCOUNTER — Other Ambulatory Visit: Payer: Self-pay

## 2017-09-23 ENCOUNTER — Ambulatory Visit (HOSPITAL_COMMUNITY)
Admission: EM | Admit: 2017-09-23 | Discharge: 2017-09-23 | Disposition: A | Discharge status: Home / Self Care | Payer: Self-pay | Attending: Internal Medicine | Admitting: Internal Medicine

## 2017-09-23 ENCOUNTER — Encounter (HOSPITAL_COMMUNITY): Payer: Self-pay | Admitting: *Deleted

## 2017-09-23 DIAGNOSIS — H1031 Unspecified acute conjunctivitis, right eye: Secondary | ICD-10-CM

## 2017-09-23 DIAGNOSIS — B349 Viral infection, unspecified: Secondary | ICD-10-CM

## 2017-09-23 HISTORY — DX: Anxiety disorder, unspecified: F41.9

## 2017-09-23 MED ORDER — ACETAMINOPHEN 325 MG PO TABS
650.0000 mg | ORAL_TABLET | Freq: Once | ORAL | Status: AC
Start: 2017-09-23 — End: 2017-09-23
  Administered 2017-09-23: 650 mg via ORAL

## 2017-09-23 MED ORDER — ACETAMINOPHEN 325 MG PO TABS
ORAL_TABLET | ORAL | Status: AC
  Filled 2017-09-23: qty 2

## 2017-09-23 MED ORDER — IPRATROPIUM BROMIDE 0.06 % NA SOLN: 2.0000 | Freq: Four times a day (QID) | NASAL | 0 refills | Status: DC

## 2017-09-23 MED ORDER — OFLOXACIN 0.3 % OP SOLN: OPHTHALMIC | 0 refills | Status: DC

## 2017-09-23 MED ORDER — FLUTICASONE PROPIONATE 50 MCG/ACT NA SUSP: 2.0000 | Freq: Every day | NASAL | 0 refills | Status: DC

## 2017-09-23 MED ORDER — MELOXICAM 7.5 MG PO TABS: 7.5000 mg | ORAL_TABLET | Freq: Every day | ORAL | 0 refills | Status: DC

## 2017-09-23 NOTE — Discharge Instructions (Signed)
Mobic for body aches. Start flonase, atrovent nasal spray for nasal congestion/drainage. You can use over the counter nasal saline rinse such as neti pot for nasal congestion. Keep hydrated, your urine should be clear to pale yellow in color. Tylenol/motrin for fever and pain. Monitor for any worsening of symptoms, chest pain, shortness of breath, wheezing, swelling of the throat, follow up for reevaluation.   Use ofloxacin eyedrops as directed on both eyes. Lid scrubs and warm compresses as directed. Monitor for any worsening of symptoms, changes in vision, sensitivity to light, eye swelling, follow up with ophthalmology for further evaluation.

## 2017-09-23 NOTE — ED Provider Notes (Signed)
[facility]    CSN: [id number] Arrival date & time: 09/23/17  1832     History   Chief Complaint Chief Complaint  Patient presents with  . Eye Problem  . Cough    HPI Sabrina Le is a 27 y.o. female.   27 year old female comes in for 3-day history of bilateral eye irritation. Crusting of the eyes with pruritis. Intermittent blurry vision. No photophobia, injury/trauma.  Does wear contacts, and removed contacts yesterday, has not used today.  She has contact with someone who has conjunctivitis.  She has also had 2-day history of URI symptoms.  She has had rhinorrhea, nasal congestion, body aches, mild sore throat.  Denies cough.  Denies fever, chills, night sweats.  OTC cold medications without relief.  Current every day smoker, 12-pack-year history. Positive sick contact.      Past Medical History:  Diagnosis Date  . Anxiety   . Bipolar disorder (HCC)   . Shoulder dislocation     There are no active problems to display for this patient.   History reviewed. No pertinent surgical history.  OB History    No data available       Home Medications    Prior to Admission medications   Medication Sig Start Date End Date Taking? Authorizing Provider  fluticasone (FLONASE) 50 MCG/ACT nasal spray Place 2 sprays into both nostrils daily. 09/23/17   Cathie Hoops, Nikolos Billig V, PA-C  ipratropium (ATROVENT) 0.06 % nasal spray Place 2 sprays into both nostrils 4 (four) times daily. 09/23/17   Cathie Hoops, Psalm Schappell V, PA-C  meloxicam (MOBIC) 7.5 MG tablet Take 1 tablet (7.5 mg total) by mouth daily. 09/23/17   Cathie Hoops, Isaac Lacson V, PA-C  ofloxacin (OCUFLOX) 0.3 % ophthalmic solution 1 drop in both eyes every 4 hours for the first 2 days, then 4 times a day for the next 5 days. 09/23/17   Belinda Fisher, PA-C    Family History No family history on file.  Social History Social History   Tobacco Use  . Smoking status: Current Every Day Smoker    Packs/day: 1.00  . Smokeless tobacco: Never Used  Substance Use Topics    . Alcohol use: No    Frequency: Never  . Drug use: Yes    Types: Marijuana     Allergies   Patient has no known allergies.   Review of Systems Review of Systems  Reason unable to perform ROS: See HPI as above.     Physical Exam Triage Vital Signs ED Triage Vitals  Enc Vitals Group     BP 09/23/17 1937 (!) 143/92     Pulse Rate 09/23/17 1937 87     Resp 09/23/17 1937 16     Temp 09/23/17 1937 97.8 F (36.6 C)     Temp Source 09/23/17 1937 Oral     SpO2 09/23/17 1937 95 %     Weight --      Height --      Head Circumference --      Peak Flow --      Pain Score 09/23/17 1938 9     Pain Loc --      Pain Edu? --      Excl. in GC? --    No data found.  Updated Vital Signs BP (!) 143/92 (BP Location: Left Arm) Comment: Nurse Marthann Schiller aware of the BP  Pulse 87   Temp 97.8 F (36.6 C) (Oral)   Resp 16   LMP 09/20/2017 (Exact  Date)   SpO2 95%   Visual Acuity Right Eye Distance: 20/50 uncorrected Left Eye Distance: 20/50 uncorrected Bilateral Distance: 20/50 uncorrected  Right Eye Near:   Left Eye Near:    Bilateral Near:     Physical Exam  Constitutional: She is oriented to person, place, and time. She appears well-developed and well-nourished. No distress.  HENT:  Head: Normocephalic and atraumatic.  Right Ear: External ear and ear canal normal. Tympanic membrane is erythematous. Tympanic membrane is not bulging.  Left Ear: External ear and ear canal normal. Tympanic membrane is erythematous. Tympanic membrane is not bulging.  Nose: Mucosal edema and rhinorrhea present. Right sinus exhibits no maxillary sinus tenderness and no frontal sinus tenderness. Left sinus exhibits no maxillary sinus tenderness and no frontal sinus tenderness.  Mouth/Throat: Uvula is midline, oropharynx is clear and moist and mucous membranes are normal. No tonsillar exudate.  Eyes: EOM and lids are normal. Pupils are equal, round, and reactive to light. Right conjunctiva is  injected. Left conjunctiva is not injected.  No ciliary injection.  Neck: Normal range of motion. Neck supple.  Cardiovascular: Normal rate, regular rhythm and normal heart sounds. Exam reveals no gallop and no friction rub.  No murmur heard. Pulmonary/Chest: Effort normal and breath sounds normal. She has no decreased breath sounds. She has no wheezes. She has no rhonchi. She has no rales.  Lymphadenopathy:    She has no cervical adenopathy.  Neurological: She is alert and oriented to person, place, and time.  Skin: Skin is warm and dry.  Psychiatric: She has a normal mood and affect. Her behavior is normal. Judgment normal.   UC Treatments / Results  Labs (all labs ordered are listed, but only abnormal results are displayed) Labs Reviewed - No data to display  EKG  EKG Interpretation None       Radiology No results found.  Procedures Procedures (including critical care time)  Medications Ordered in UC Medications  acetaminophen (TYLENOL) tablet 650 mg (650 mg Oral Given 09/23/17 1945)     Initial Impression / Assessment and Plan / UC Course  I have reviewed the triage vital signs and the nursing notes.  Pertinent labs & imaging results that were available during my care of the patient were reviewed by me and considered in my medical decision making (see chart for details).    Discussed with patient history and exam most consistent with viral URI. Symptomatic treatment as needed. Push fluids. Return precautions given.   Start ofloxacin drops as directed.  No contact lens use for the next 7 days. Lid scrubs and warm compresses as directed. Patient to follow up with ophthalmology if symptoms worsens or does not improve. Return precautions given.   Final Clinical Impressions(s) / UC Diagnoses   Final diagnoses:  Viral illness  Acute conjunctivitis of right eye, unspecified acute conjunctivitis type    ED Discharge Orders        Ordered    ofloxacin (OCUFLOX) 0.3 %  ophthalmic solution     09/23/17 2001    fluticasone (FLONASE) 50 MCG/ACT nasal spray  Daily     09/23/17 2001    ipratropium (ATROVENT) 0.06 % nasal spray  4 times daily     09/23/17 2001    meloxicam (MOBIC) 7.5 MG tablet  Daily     09/23/17 2001         Boleslaw Borghi V, PA-C 09/23/17 2015

## 2017-09-23 NOTE — ED Triage Notes (Signed)
C/O right eye pruritis and irritation x 3 days; left eye has irritation as well.  Has been wearing contact lenses.  Today c/o blurred vision right eye.  Also c/o cough, congestion, body aches x 2 days.  Denies fevers.

## 2017-09-29 ENCOUNTER — Encounter (HOSPITAL_COMMUNITY): Payer: Self-pay | Admitting: Family Medicine

## 2017-09-29 ENCOUNTER — Ambulatory Visit (HOSPITAL_COMMUNITY)
Admission: EM | Admit: 2017-09-29 | Discharge: 2017-09-29 | Disposition: A | Discharge status: Home / Self Care | Payer: Self-pay | Attending: Family Medicine | Admitting: Family Medicine

## 2017-09-29 DIAGNOSIS — J0100 Acute maxillary sinusitis, unspecified: Secondary | ICD-10-CM

## 2017-09-29 MED ORDER — DOXYCYCLINE HYCLATE 100 MG PO TABS: 100.0000 mg | ORAL_TABLET | Freq: Two times a day (BID) | ORAL | 0 refills | Status: AC

## 2017-09-29 NOTE — Discharge Instructions (Signed)
Continue to push fluids, practice good hand hygiene, and cover your mouth if you cough.  If you start having fevers, shaking or shortness of breath, seek immediate care.  Ibuprofen 400-600 mg (2-3 over the counter strength tabs) every 6 hours as needed for pain.  OK to take Tylenol 1000 mg (2 extra strength tabs) or 975 mg (3 regular strength tabs) every 6 hours as needed.  Continue Flonase 2 sprays each side.

## 2017-09-29 NOTE — ED Triage Notes (Signed)
Pt here for URI symptoms. Was seen here a few days ago and dx with conjunctivitis. Denies fever.

## 2017-09-29 NOTE — ED Provider Notes (Signed)
  [facility]    CSN: [id number] Arrival date & time: 09/29/17  1911   Chief Complaint  Patient presents with  . Cough    Daine GipAudrey Chittick here for URI complaints.  Duration: 3 days  Associated symptoms: subjective fever, sinus congestion, sinus pain, rhinorrhea, itchy watery eyes and ear fullness Denies: ear pain, ear drainage, sore throat, wheezing, shortness of breath, myalgia and cough Treatment to date: Ibuprofen Sick contacts: No  ROS:  Const: +subj fevers HEENT: As noted in HPI Lungs: No SOB  Past Medical History:  Diagnosis Date  . Anxiety   . Bipolar disorder (HCC)   . Shoulder dislocation    History reviewed. No pertinent family history.  BP 104/69   Pulse 62   Temp (!) 97.5 F (36.4 C)   Resp 18   LMP 09/20/2017 (Exact Date)   SpO2 100%  General: Awake, alert, appears stated age HEENT: AT, [place], ears patent b/l and TM's neg, +TTP over max sinuses b/l, nares patent w/o discharge, pharynx pink and without exudates, MMM Neck: No masses or asymmetry Heart: RRR Lungs: CTAB, no accessory muscle use Psych: Age appropriate judgment and insight, normal mood and affect   Final diagnoses:  Acute maxillary sinusitis, recurrence not specified    ED Discharge Orders        Ordered    doxycycline (VIBRA-TABS) 100 MG tablet  2 times daily     09/29/17 2054     Continue to push fluids, practice good hand hygiene, cover mouth when coughing. NSAIDs, Tylenol.  Letter for work given excusing her for today. F/u prn. If starting to experience fevers, shaking, or shortness of breath, seek immediate care. Pt voiced understanding and agreement to the plan.   Sharlene DoryWendling, Damiano Stamper Paul, [place] 09/29/17 2146

## 2017-10-01 ENCOUNTER — Other Ambulatory Visit: Payer: Self-pay | Admitting: Family Medicine

## 2017-10-09 ENCOUNTER — Ambulatory Visit (HOSPITAL_COMMUNITY)
Admission: EM | Admit: 2017-10-09 | Discharge: 2017-10-09 | Disposition: A | Discharge status: Home / Self Care | Payer: Self-pay | Attending: Family Medicine | Admitting: Family Medicine

## 2017-10-09 ENCOUNTER — Encounter (HOSPITAL_COMMUNITY): Payer: Self-pay | Admitting: Emergency Medicine

## 2017-10-09 DIAGNOSIS — J320 Chronic maxillary sinusitis: Secondary | ICD-10-CM

## 2017-10-09 MED ORDER — AMOXICILLIN 875 MG PO TABS: 875.0000 mg | ORAL_TABLET | Freq: Two times a day (BID) | ORAL | 0 refills | Status: DC

## 2017-10-09 MED ORDER — PREDNISONE 20 MG PO TABS: ORAL_TABLET | ORAL | 0 refills | Status: DC

## 2017-10-09 NOTE — Discharge Instructions (Signed)
Use Afrin (oxymetazoline) at night

## 2017-10-09 NOTE — ED Triage Notes (Signed)
Pt here for URI sx not improved with meds

## 2017-10-09 NOTE — ED Provider Notes (Signed)
  [facility]   [id number] 10/09/17 Arrival Time: 1642   SUBJECTIVE:  Sabrina Le is a 27 y.o. female who presents to the urgent care with complaint of URI sx not improved with meds after being seen here 10 days ago and prescribed doxycycline.   Past Medical History:  Diagnosis Date  . Anxiety   . Bipolar disorder (HCC)   . Shoulder dislocation    History reviewed. No pertinent family history. Social History   Socioeconomic History  . Marital status: Single    Spouse name: Not on file  . Number of children: Not on file  . Years of education: Not on file  . Highest education level: Not on file  Social Needs  . Financial resource strain: Not on file  . Food insecurity - worry: Not on file  . Food insecurity - inability: Not on file  . Transportation needs - medical: Not on file  . Transportation needs - non-medical: Not on file  Occupational History  . Not on file  Tobacco Use  . Smoking status: Current Every Day Smoker    Packs/day: 1.00  . Smokeless tobacco: Never Used  Substance and Sexual Activity  . Alcohol use: No    Frequency: Never  . Drug use: Yes    Types: Marijuana  . Sexual activity: Not on file  Other Topics Concern  . Not on file  Social History Narrative  . Not on file   No outpatient medications have been marked as taking for the 10/09/17 encounter [facility](Hospital Encounter).   No Known Allergies    ROS: As per HPI, remainder of ROS negative.   OBJECTIVE:   Vitals:   10/09/17 1712  BP: (!) 144/87  Pulse: 72  Resp: 18  Temp: 98.4 F (36.9 C)  TempSrc: Oral  SpO2: 99%     General appearance: alert; no distress Eyes: PERRL; EOMI; conjunctiva normal HENT: normocephalic; atraumatic; TMs normal, canal normal, external ears normal without trauma; nasal mucosa blood clots; oral mucosa normal Neck: supple Lungs: clear to auscultation bilaterally Heart: regular rate and rhythm Back: no CVA tenderness Extremities: no cyanosis or  edema; symmetrical with no gross deformities Skin: warm and dry Neurologic: normal gait; grossly normal Psychological: alert and cooperative; normal mood and affect      Labs:  Results for orders placed or performed during the hospital encounter of 03/07/17  I-Stat beta hCG blood, ED  Result Value Ref Range   I-stat hCG, quantitative <5.0 <5 mIU/mL   Comment 3            Labs Reviewed - No data to display  No results found.     ASSESSMENT & PLAN:  1. Chronic maxillary sinusitis     Meds ordered this encounter  Medications  . amoxicillin (AMOXIL) 875 MG tablet    Sig: Take 1 tablet (875 mg total) by mouth 2 (two) times daily.    Dispense:  20 tablet    Refill:  0  . predniSONE (DELTASONE) 20 MG tablet    Sig: Two daily with food    Dispense:  6 tablet    Refill:  0    Reviewed expectations re: course of current medical issues. Questions answered. Outlined signs and symptoms indicating need for more acute intervention. Patient verbalized understanding. After Visit Summary given.      Elvina SidleLauenstein, Loel Betancur, MD 10/09/17 1734

## 2018-02-27 IMAGING — DX DG SHOULDER 2+V*R*
3 series · 3 of 3 positions shown · non-contrast
Comparison: Plain films of the right shoulder 02/12/2017 and
02/13/2017.

CLINICAL DATA: The patient reports right shoulder instability. She
experienced onset of pain and limited range of motion after waking
up 1.5 hours ago.

EXAM:
RIGHT SHOULDER - 2+ VIEW

[shoulder ap]
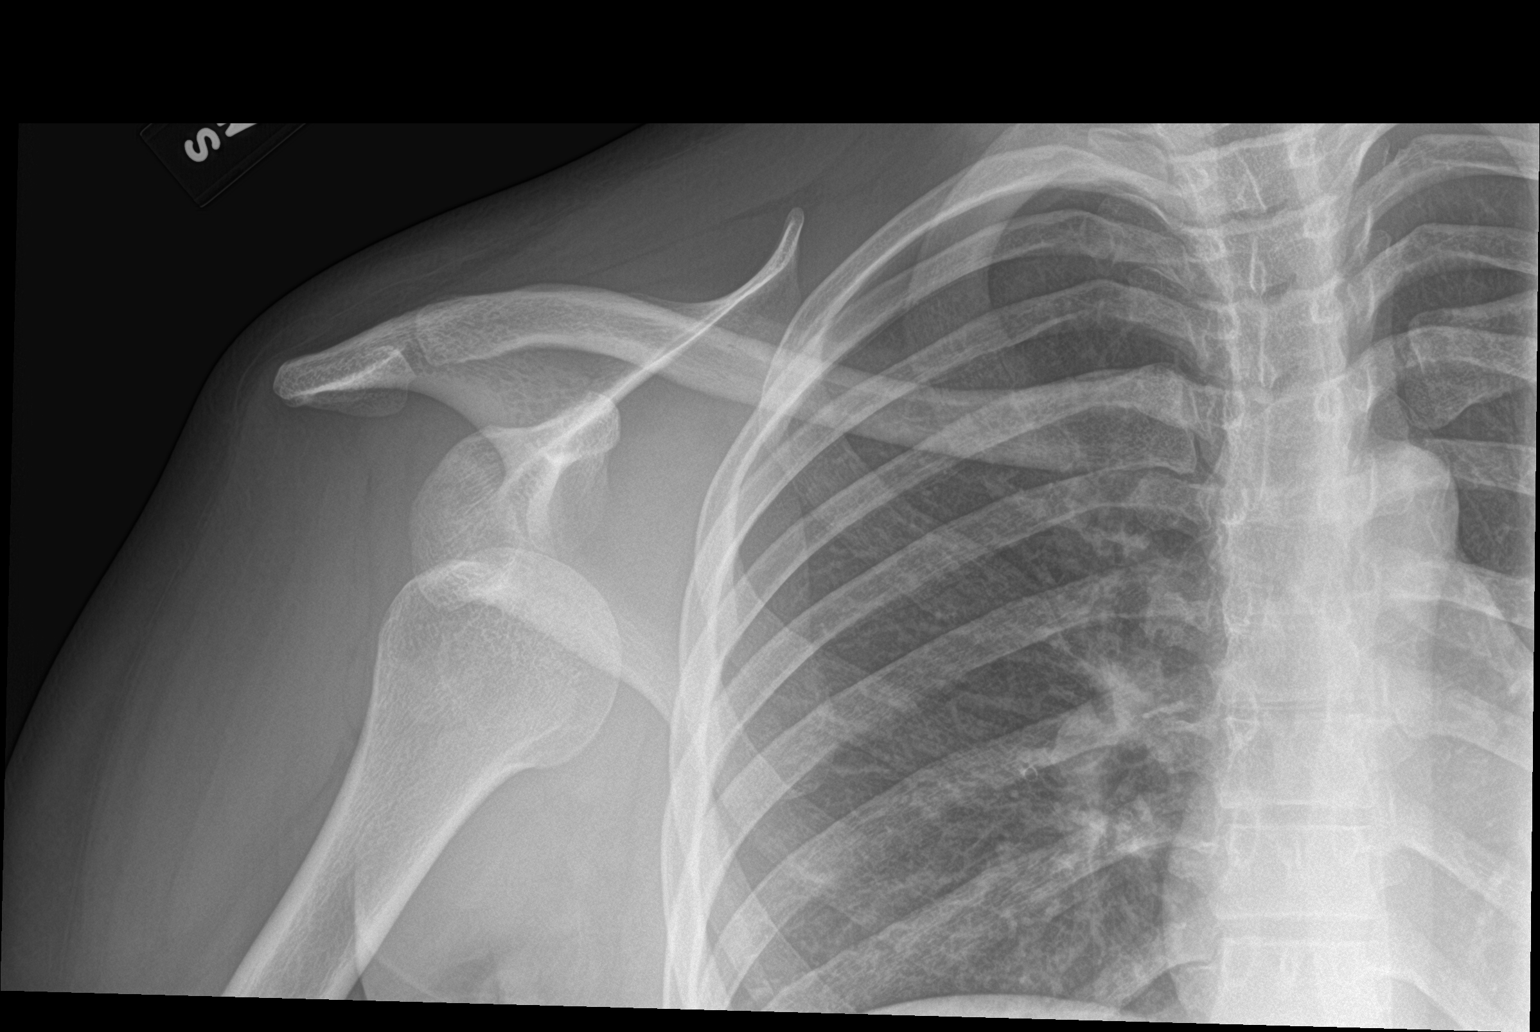

[shoulder grashey]
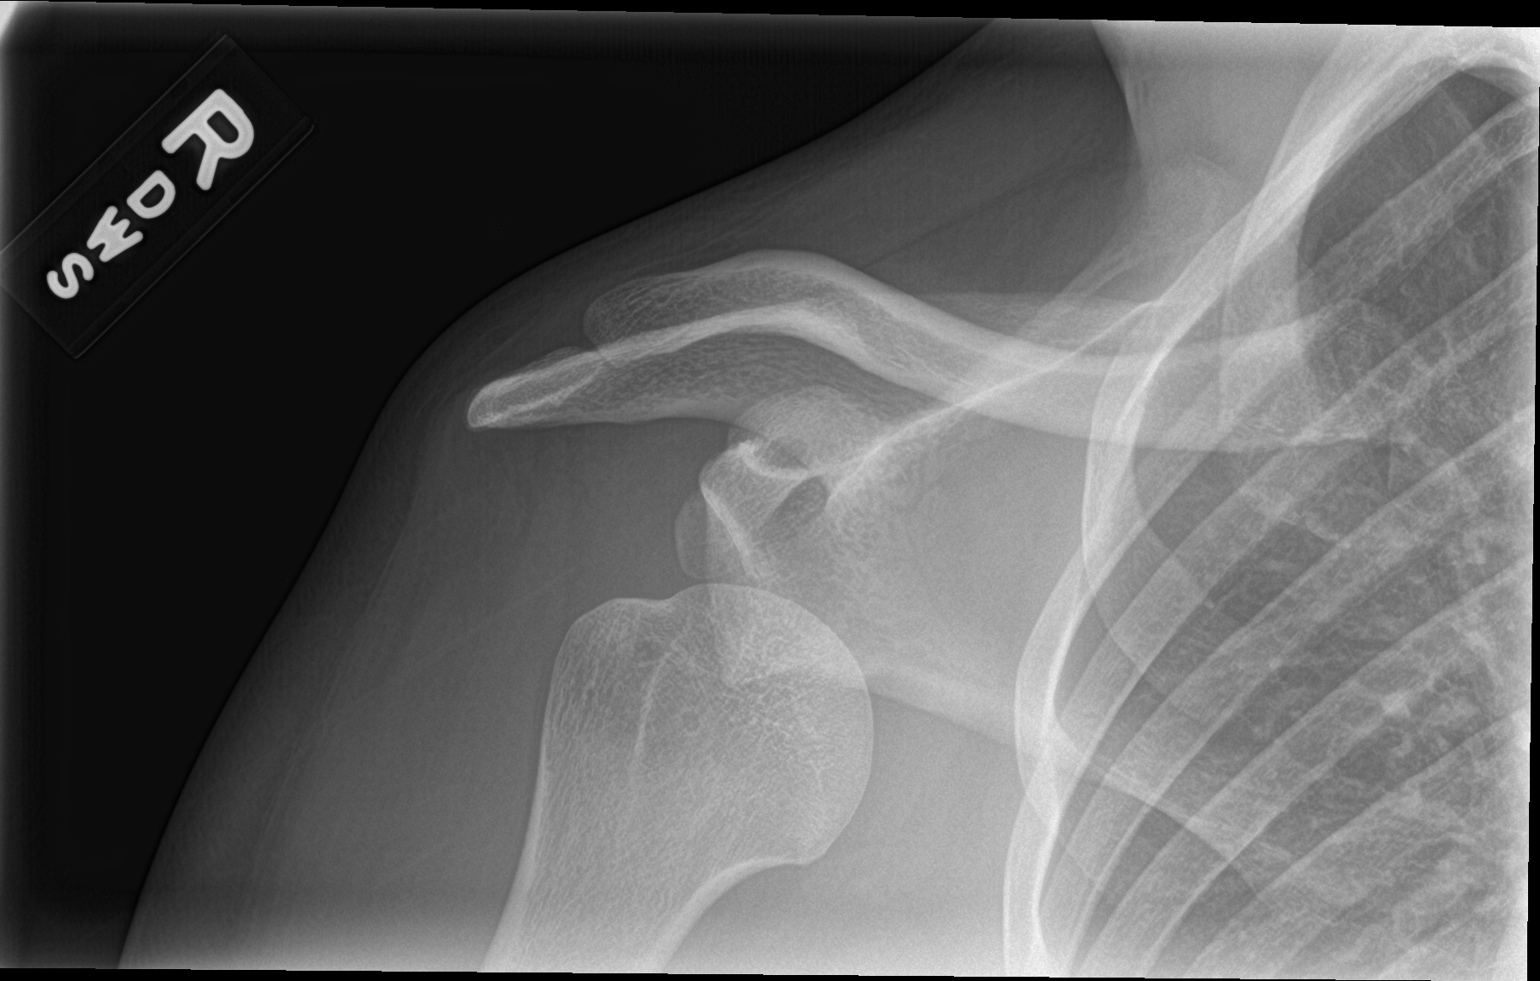

[shoulder y-view]
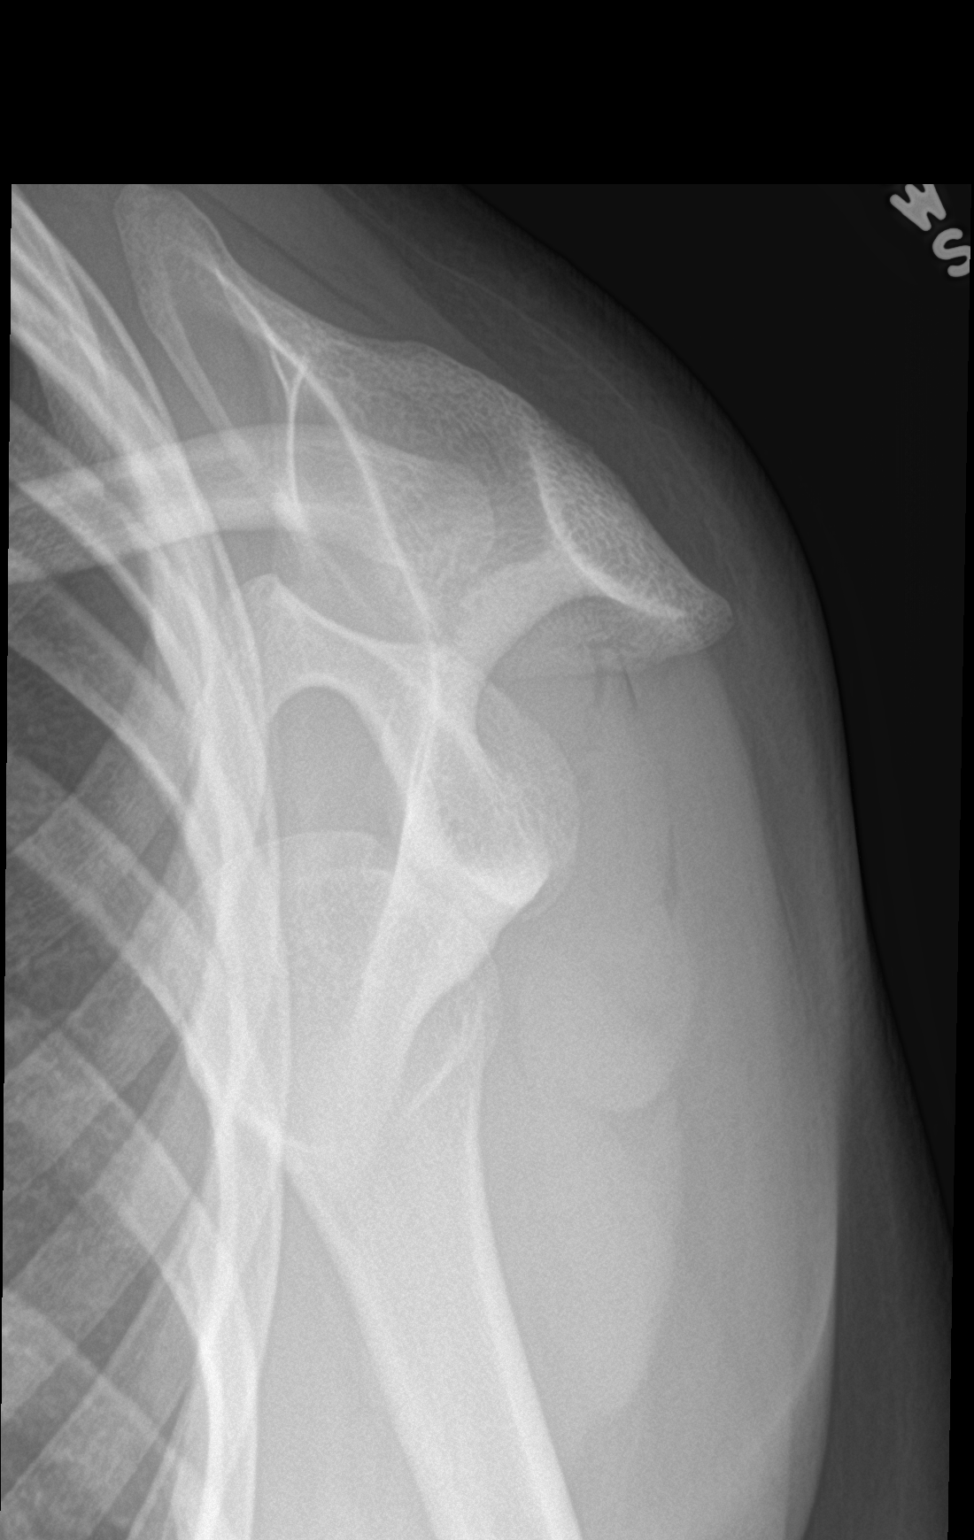

[3 of 3 positions shown; findings below may reference images not displayed]

FINDINGS: The right shoulder is anteriorly dislocated. Hill-Sachs lesion is
seen. The acromioclavicular joint is intact.
IMPRESSION: Anterior right shoulder dislocation with an associated Hill-Sachs
lesion.

## 2018-02-27 IMAGING — DX DG SHOULDER 2+V PORT*R*
2 series · 2 of 2 positions shown · non-contrast
Comparison: Films from earlier in the same day.

CLINICAL DATA: Status post reduction

EXAM:
PORTABLE RIGHT SHOULDER

[shoulder ap]
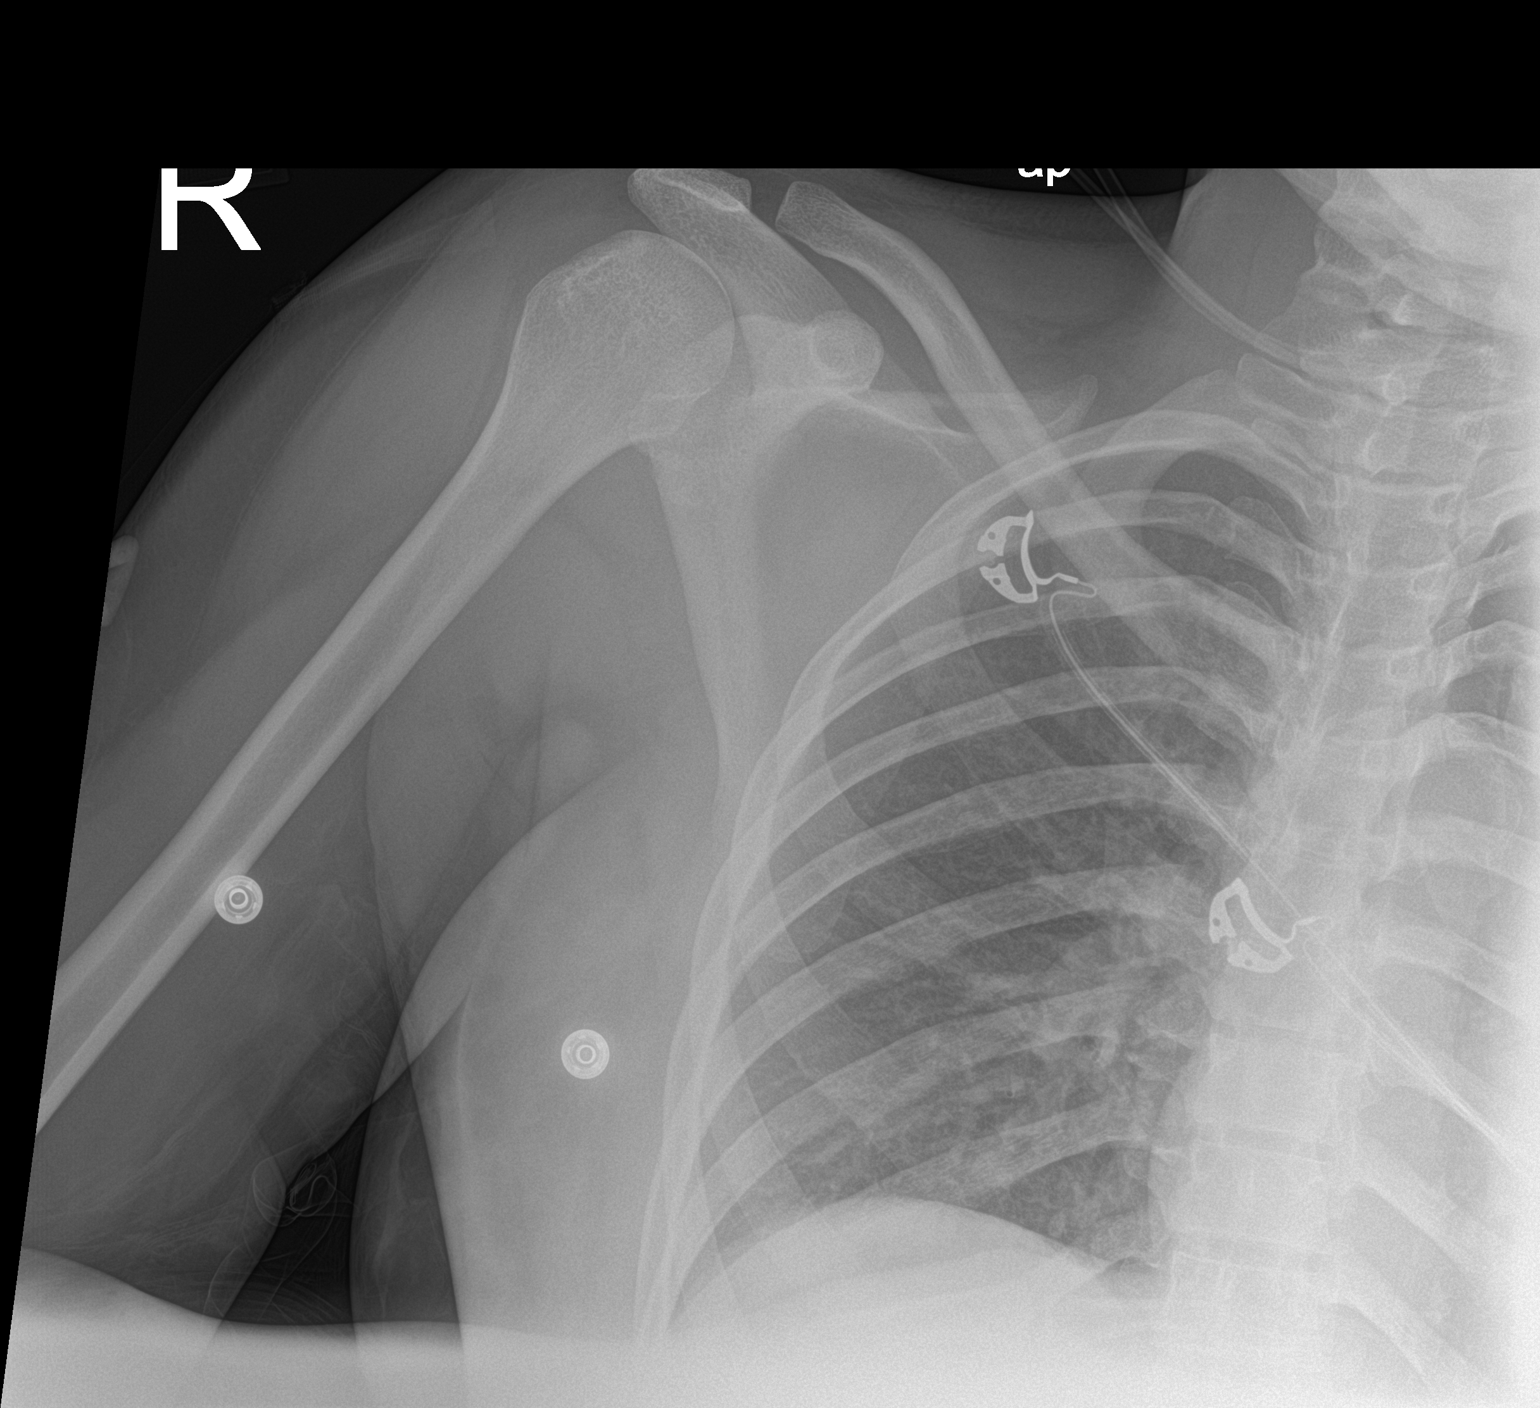

[scapula lat]
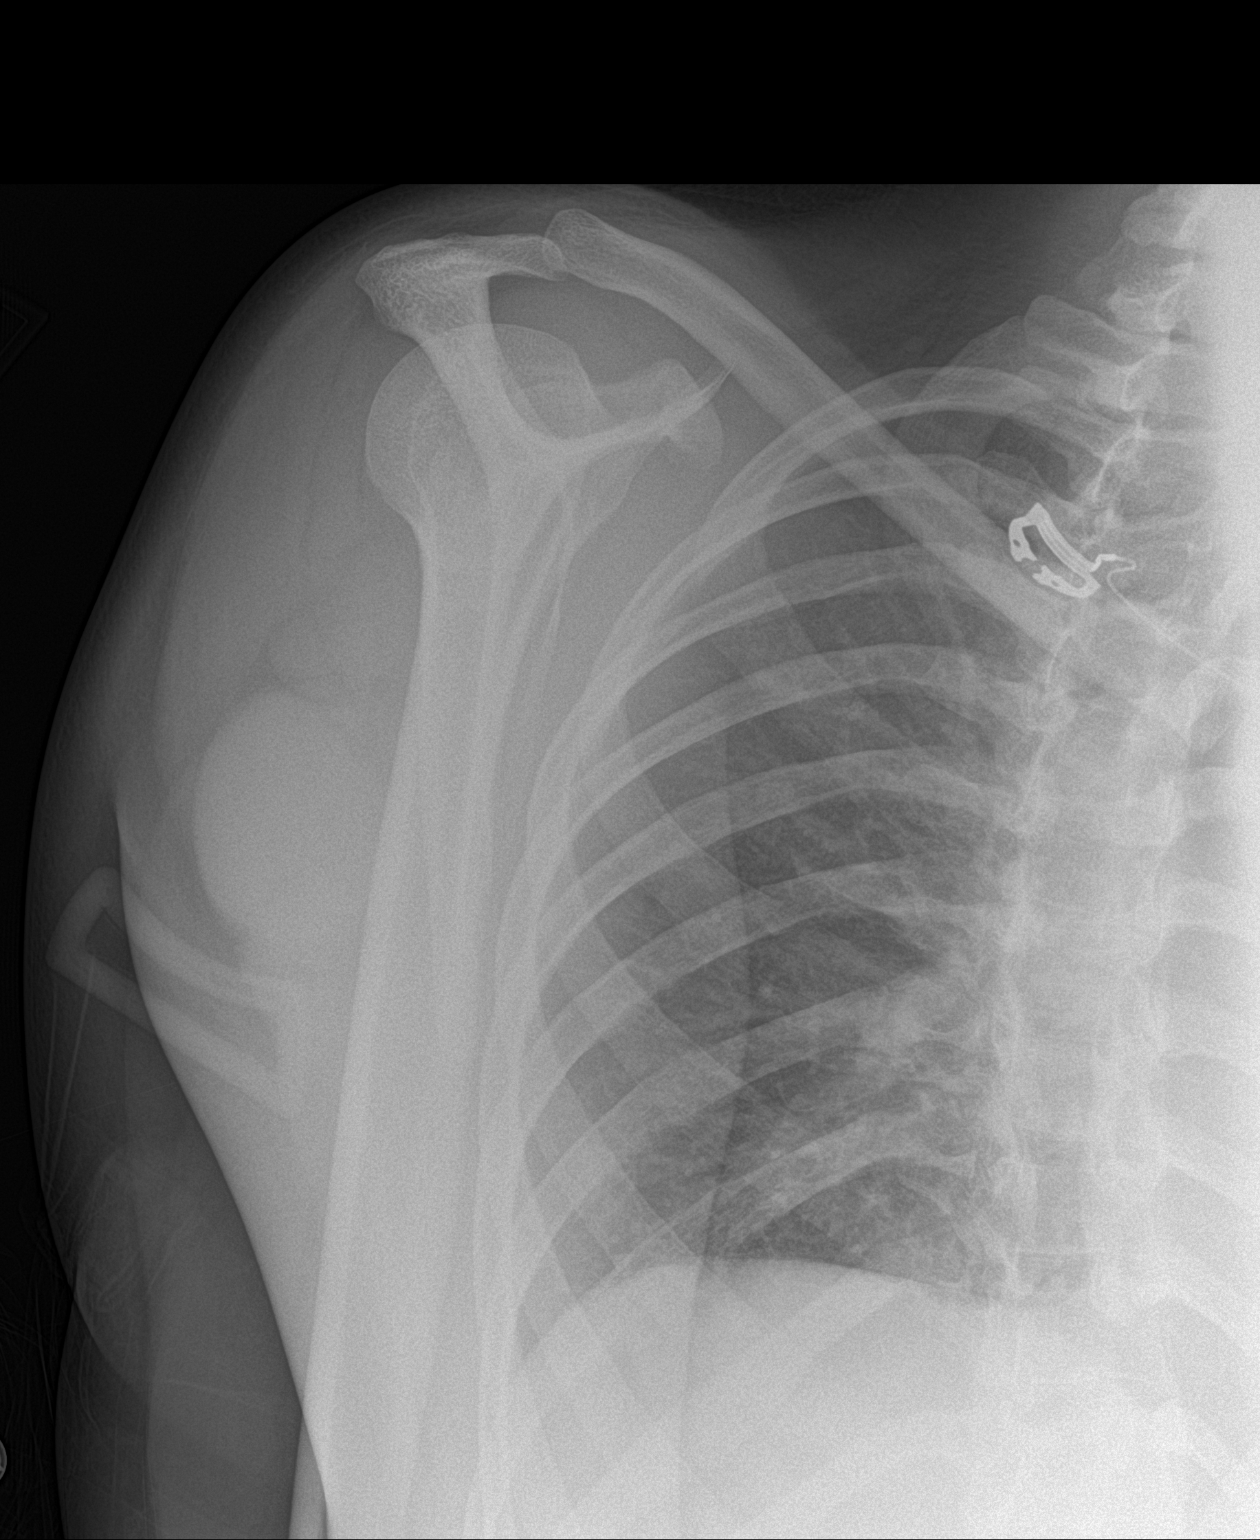

[2 of 2 positions shown; findings below may reference images not displayed]

FINDINGS: The humeral head is been relocated into the glenoid. No acute
fracture is noted. The underlying bony thorax is unremarkable.
IMPRESSION: Status post reduction of humeral head.

## 2018-06-29 ENCOUNTER — Encounter (HOSPITAL_COMMUNITY): Payer: Self-pay | Admitting: Emergency Medicine

## 2018-06-29 ENCOUNTER — Ambulatory Visit (HOSPITAL_COMMUNITY)
Admission: EM | Admit: 2018-06-29 | Discharge: 2018-06-29 | Disposition: A | Discharge status: Home / Self Care | Payer: Self-pay | Attending: Radiology | Admitting: Radiology

## 2018-06-29 DIAGNOSIS — F17210 Nicotine dependence, cigarettes, uncomplicated: Secondary | ICD-10-CM | POA: Insufficient documentation

## 2018-06-29 DIAGNOSIS — M7918 Myalgia, other site: Secondary | ICD-10-CM

## 2018-06-29 DIAGNOSIS — R6889 Other general symptoms and signs: Secondary | ICD-10-CM

## 2018-06-29 DIAGNOSIS — R0981 Nasal congestion: Secondary | ICD-10-CM | POA: Insufficient documentation

## 2018-06-29 DIAGNOSIS — F419 Anxiety disorder, unspecified: Secondary | ICD-10-CM | POA: Insufficient documentation

## 2018-06-29 DIAGNOSIS — F319 Bipolar disorder, unspecified: Secondary | ICD-10-CM | POA: Insufficient documentation

## 2018-06-29 DIAGNOSIS — J029 Acute pharyngitis, unspecified: Secondary | ICD-10-CM | POA: Insufficient documentation

## 2018-06-29 LAB — POCT RAPID STREP A: Streptococcus, Group A Screen (Direct): NEGATIVE

## 2018-06-29 MED ORDER — DEXAMETHASONE SODIUM PHOSPHATE 10 MG/ML IJ SOLN
INTRAMUSCULAR | Status: AC
  Filled 2018-06-29: qty 1

## 2018-06-29 MED ORDER — OSELTAMIVIR PHOSPHATE 75 MG PO CAPS: 75.0000 mg | ORAL_CAPSULE | Freq: Two times a day (BID) | ORAL | 0 refills | Status: DC

## 2018-06-29 MED ORDER — DEXAMETHASONE SODIUM PHOSPHATE 10 MG/ML IJ SOLN
10.0000 mg | Freq: Once | INTRAMUSCULAR | Status: AC
  Administered 2018-06-29: 10 mg via INTRAMUSCULAR

## 2018-06-29 NOTE — ED Triage Notes (Signed)
Pt c/o sore throat, body aches, chills, congestion.

## 2018-06-29 NOTE — Discharge Instructions (Addendum)
Continue to push fluids and take over the counter medications as directed on the back of the box for symptomatic relief.  ° °

## 2018-06-29 NOTE — ED Provider Notes (Signed)
[facility]    CSN: [id number] Arrival date & time: 06/29/18  1558     History   Chief Complaint Chief Complaint  Patient presents with  . Nasal Congestion  . Generalized Body Aches  . Sore Throat    HPI Sabrina Le is a 27 y.o. female.   27 year old female presents with upper respiratory congestion sore throat and a low-grade fever x2 days denies any nausea vomiting diarrhea.  Patient condition is acute in nature.  Condition is made worse by nothing.  Patient made better by nothing.  Patient denies relief with over-the-counter medications taken prior to arrival at this facility.  Patient reports coworkers with similar signs and symptoms.     Past Medical History:  Diagnosis Date  . Anxiety   . Bipolar disorder (HCC)   . Shoulder dislocation     There are no active problems to display for this patient.   History reviewed. No pertinent surgical history.  OB History   None      Home Medications    Prior to Admission medications   Medication Sig Start Date End Date Taking? Authorizing Provider  oxymetazoline (MUCINEX SINUS-MAX CLEAR & COOL) 0.05 % nasal spray Place 1 spray into both nostrils 2 (two) times daily.   Yes [provider]  amoxicillin (AMOXIL) 875 MG tablet Take 1 tablet (875 mg total) by mouth 2 (two) times daily. Patient not taking: Reported on 06/29/2018 10/09/17   Elvina SidleLauenstein, Kurt, MD  predniSONE (DELTASONE) 20 MG tablet Two daily with food Patient not taking: Reported on 06/29/2018 10/09/17   Elvina SidleLauenstein, Kurt, MD    Family History No family history on file.  Social History Social History   Tobacco Use  . Smoking status: Current Every Day Smoker    Packs/day: 1.00  . Smokeless tobacco: Never Used  Substance Use Topics  . Alcohol use: No    Frequency: Never  . Drug use: Yes    Types: Marijuana     Allergies   Patient has no known allergies.   Review of Systems Review of Systems  Constitutional: Positive for  fever. Negative for chills.  HENT: Positive for congestion and sore throat. Negative for ear pain.   Eyes: Negative for pain and visual disturbance.  Respiratory: Negative for cough and shortness of breath.   Cardiovascular: Negative for chest pain and palpitations.  Gastrointestinal: Negative for abdominal pain and vomiting.  Genitourinary: Negative for dysuria and hematuria.  Musculoskeletal: Negative for arthralgias and back pain.  Skin: Negative for color change and rash.  Neurological: Negative for seizures and syncope.  All other systems reviewed and are negative.    Physical Exam Triage Vital Signs ED Triage Vitals [06/29/18 1814]  Enc Vitals Group     BP (!) 143/84     Pulse Rate 80     Resp 16     Temp 97.7 F (36.5 C)     Temp src      SpO2 100 %     Weight      Height      Head Circumference      Peak Flow      Pain Score 9     Pain Loc      Pain Edu?      Excl. in GC?    No data found.  Updated Vital Signs BP (!) 143/84   Pulse 80   Temp 97.7 F (36.5 C)   Resp 16   LMP 06/25/2018   SpO2  100%   Visual Acuity Right Eye Distance:   Left Eye Distance:   Bilateral Distance:    Right Eye Near:   Left Eye Near:    Bilateral Near:     Physical Exam  Constitutional: She is oriented to person, place, and time. She appears well-developed and well-nourished.  HENT:  Head: Normocephalic and atraumatic.  Right Ear: Tympanic membrane normal.  Left Ear: Tympanic membrane normal.  Posterior oropharyngeal erythema noted  Eyes: Conjunctivae are normal.  Neck: Normal range of motion.  Pulmonary/Chest: Effort normal.  Musculoskeletal: Normal range of motion.  Neurological: She is alert and oriented to person, place, and time.  Skin: Skin is warm.  Psychiatric: She has a normal mood and affect.  Nursing note and vitals reviewed.    UC Treatments / Results  Labs (all labs ordered are listed, but only abnormal results are displayed) Labs Reviewed - No  data to display  EKG None  Radiology No results found.  Procedures Procedures (including critical care time)  Medications Ordered in UC Medications  dexamethasone (DECADRON) injection 10 mg (has no administration in time range)    Initial Impression / Assessment and Plan / UC Course  I have reviewed the triage vital signs and the nursing notes.  Pertinent labs & imaging results that were available during my care of the patient were reviewed by me and considered in my medical decision making (see chart for details).      Final Clinical Impressions(s) / UC Diagnoses   Final diagnoses:  None   Discharge Instructions   None    ED Prescriptions    None     Controlled Substance Prescriptions  Controlled Substance Registry consulted? Not Applicable   Alene Mires, NP 06/29/18 1916

## 2018-07-02 LAB — CULTURE, GROUP A STREP (THRC)

## 2018-10-19 ENCOUNTER — Encounter (HOSPITAL_COMMUNITY): Payer: Self-pay

## 2018-10-19 ENCOUNTER — Other Ambulatory Visit: Payer: Self-pay

## 2018-10-19 ENCOUNTER — Emergency Department (HOSPITAL_COMMUNITY): Payer: Self-pay

## 2018-10-19 ENCOUNTER — Emergency Department (HOSPITAL_COMMUNITY)
Admission: EM | Admit: 2018-10-19 | Discharge: 2018-10-20 | Disposition: A | Discharge status: Home / Self Care | Payer: Self-pay | Attending: Emergency Medicine | Admitting: Emergency Medicine

## 2018-10-19 DIAGNOSIS — R531 Weakness: Secondary | ICD-10-CM | POA: Insufficient documentation

## 2018-10-19 DIAGNOSIS — R0602 Shortness of breath: Secondary | ICD-10-CM | POA: Insufficient documentation

## 2018-10-19 DIAGNOSIS — F17200 Nicotine dependence, unspecified, uncomplicated: Secondary | ICD-10-CM | POA: Insufficient documentation

## 2018-10-19 DIAGNOSIS — F419 Anxiety disorder, unspecified: Secondary | ICD-10-CM | POA: Insufficient documentation

## 2018-10-19 DIAGNOSIS — Z79899 Other long term (current) drug therapy: Secondary | ICD-10-CM | POA: Insufficient documentation

## 2018-10-19 DIAGNOSIS — Z789 Other specified health status: Secondary | ICD-10-CM

## 2018-10-19 DIAGNOSIS — F1099 Alcohol use, unspecified with unspecified alcohol-induced disorder: Secondary | ICD-10-CM | POA: Insufficient documentation

## 2018-10-19 LAB — CBC
HEMATOCRIT: 38.7 % (ref 36.0–46.0)
Hemoglobin: 12.1 g/dL (ref 12.0–15.0)
MCH: 26.4 pg (ref 26.0–34.0)
MCHC: 31.3 g/dL (ref 30.0–36.0)
MCV: 84.3 fL (ref 80.0–100.0)
PLATELETS: 396 10*3/uL (ref 150–400)
RBC: 4.59 MIL/uL (ref 3.87–5.11)
RDW: 14.1 % (ref 11.5–15.5)
WBC: 14.1 10*3/uL — AB (ref 4.0–10.5)
nRBC: 0 % (ref 0.0–0.2)

## 2018-10-19 MED ORDER — SODIUM CHLORIDE 0.9 % IV BOLUS
1000.0000 mL | Freq: Once | INTRAVENOUS | Status: AC
  Administered 2018-10-19: 1000 mL via INTRAVENOUS

## 2018-10-19 NOTE — ED Triage Notes (Signed)
Pt states that she has been short on breathe since about 3p. Pt states that she feels that she drank too much yesterday "over he limit" due to increased stress and she has been feeling increasingly SOB and weak.

## 2018-10-19 NOTE — ED Provider Notes (Signed)
[facility] EMERGENCY DEPARTMENT Provider Note   CSN: [id number] Arrival date & time: 10/19/18  2258    History   Chief Complaint Chief Complaint  Patient presents with  . Shortness of Breath    wkness, dehydration per pt as she "drank over her limit yesterday."    HPI Sabrina Le is a 28 y.o. female with a hx of anxiety, bipolar disorder presents to the Emergency Department complaining of gradual, persistent, progressively worsening generalized weakness and shortness of breath onset around 3pm after awaking from a night of drinking.  Pt reports this started when she became anxious about attending the party.  She reports she feels dehydrated, but has not attempted to eat or drink anything.  Pt denies nausea or vomiting. She denies fever, chills, headache, neck pain, chest pain, abd pain, N/V/D, syncope, dysuria.  She denies travel or known sick contacts.  No aggravating or alleviating factors.       The history is provided by the patient and medical records. No language interpreter was used.    Past Medical History:  Diagnosis Date  . Anxiety   . Bipolar disorder (HCC)   . Shoulder dislocation     There are no active problems to display for this patient.   History reviewed. No pertinent surgical history.   OB History   No obstetric history on file.      Home Medications    Prior to Admission medications   Medication Sig Start Date End Date Taking? Authorizing Provider  amoxicillin (AMOXIL) 875 MG tablet Take 1 tablet (875 mg total) by mouth 2 (two) times daily. Patient not taking: Reported on 06/29/2018 10/09/17   Elvina Sidle, MD  oseltamivir (TAMIFLU) 75 MG capsule Take 1 capsule (75 mg total) by mouth every 12 (twelve) hours. 06/29/18   Alene Mires, NP  oxymetazoline (MUCINEX SINUS-MAX CLEAR & COOL) 0.05 % nasal spray Place 1 spray into both nostrils 2 (two) times daily.    [provider]  predniSONE (DELTASONE) 20 MG  tablet Two daily with food Patient not taking: Reported on 06/29/2018 10/09/17   Elvina Sidle, MD    Family History History reviewed. No pertinent family history.  Social History Social History   Tobacco Use  . Smoking status: Current Every Day Smoker    Packs/day: 1.00  . Smokeless tobacco: Never Used  Substance Use Topics  . Alcohol use: No    Frequency: Never  . Drug use: Yes    Types: Marijuana     Allergies   Patient has no known allergies.   Review of Systems Review of Systems  Constitutional: Negative for appetite change, diaphoresis, fatigue, fever and unexpected weight change.  HENT: Negative for mouth sores.   Eyes: Negative for visual disturbance.  Respiratory: Positive for shortness of breath. Negative for cough, chest tightness and wheezing.   Cardiovascular: Negative for chest pain.  Gastrointestinal: Negative for abdominal pain, constipation, diarrhea, nausea and vomiting.  Endocrine: Negative for polydipsia, polyphagia and polyuria.  Genitourinary: Negative for dysuria, frequency, hematuria and urgency.  Musculoskeletal: Negative for back pain and neck stiffness.  Skin: Negative for rash.  Allergic/Immunologic: Negative for immunocompromised state.  Neurological: Positive for weakness (generalized). Negative for syncope, light-headedness and headaches.  Hematological: Does not bruise/bleed easily.  Psychiatric/Behavioral: Negative for sleep disturbance. The patient is not nervous/anxious.      Physical Exam Updated Vital Signs Temp 98.7 F (37.1 C) (Oral)   Ht 5\' 7"  (1.702 m)   Wt 90.7  kg   LMP 10/15/2018   BMI 31.32 kg/m   Physical Exam Vitals signs and nursing note reviewed.  Constitutional:      General: She is not in acute distress.    Appearance: She is well-developed. She is not diaphoretic.     Comments: Awake, alert, nontoxic appearance  HENT:     Head: Normocephalic and atraumatic.     Mouth/Throat:     Pharynx: No  oropharyngeal exudate.  Eyes:     General: No scleral icterus.    Conjunctiva/sclera: Conjunctivae normal.  Neck:     Musculoskeletal: Normal range of motion and neck supple.  Cardiovascular:     Rate and Rhythm: Normal rate and regular rhythm.  Pulmonary:     Effort: Pulmonary effort is normal. No respiratory distress.     Breath sounds: Normal breath sounds. No wheezing.  Abdominal:     General: Bowel sounds are normal.     Palpations: Abdomen is soft. There is no mass.     Tenderness: There is no abdominal tenderness. There is no guarding or rebound.  Musculoskeletal: Normal range of motion.  Skin:    General: Skin is warm and dry.  Neurological:     Mental Status: She is alert.     Comments: Speech is clear and goal oriented Moves extremities without ataxia      ED Treatments / Results  Labs (all labs ordered are listed, but only abnormal results are displayed) Labs Reviewed  CBC - Abnormal; Notable for the following components:      Result Value   WBC 14.1 (*)    All other components within normal limits  BASIC METABOLIC PANEL - Abnormal; Notable for the following components:   Sodium 133 (*)    Chloride 96 (*)    All other components within normal limits  URINALYSIS, ROUTINE W REFLEX MICROSCOPIC  POC URINE PREG, ED    Radiology Dg Chest Port 1 View  Result Date: 10/20/2018 CLINICAL DATA:  Initial evaluation for acute shortness of breath. EXAM: PORTABLE CHEST 1 VIEW COMPARISON:  None available. FINDINGS: The cardiac and mediastinal silhouettes are within normal limits. The lungs are normally inflated. No airspace consolidation, pleural effusion, or pulmonary edema is identified. There is no pneumothorax. No acute osseous abnormality identified. IMPRESSION: No active cardiopulmonary disease. Electronically Signed   By: Rise Mu M.D.   On: 10/20/2018 00:05    Procedures Procedures (including critical care time)  Medications Ordered in ED Medications   sodium chloride 0.9 % bolus 1,000 mL (0 mLs Intravenous Stopped 10/20/18 0133)     Initial Impression / Assessment and Plan / ED Course  I have reviewed the triage vital signs and the nursing notes.  Pertinent labs & imaging results that were available during my care of the patient were reviewed by me and considered in my medical decision making (see chart for details).        Patient presents with complaint of shortness of breath and generalized weakness after a large volume alcohol intake last night and no p.o. intake today.  Exam is reassuring.  Vital signs are within normal limits.  Labs are without significant abnormalities.  leukocytosis is noted.  Patient is without fever or tachycardia to suggest sepsis.  Urinalysis without evidence of urinary tract infection.  Patient given fluids.  She is very anxious about simple virus exposure but has had no known exposures.  Chest x-ray is within normal limits.  No evidence of pneumonia, pulmonary edema or pneumothorax.  Patient is without tachypnea or hypoxia.  Less likely to be pulmonary embolism or COVID-19.  Highly doubt influenza.  Patient feels significantly better after fluids and food.  No vomiting here in the emergency department.  Will discharge to home.  BP 116/74   Pulse 82   Temp 98.7 F (37.1 C) (Oral)   Ht 5\' 7"  (1.702 m)   Wt 90.7 kg   LMP 10/15/2018   SpO2 100%   BMI 31.32 kg/m    Final Clinical Impressions(s) / ED Diagnoses   Final diagnoses:  Alcohol ingestion  Weakness generalized    ED Discharge Orders    None       Milta Deiters 10/20/18 0144    Ward, Layla Maw, DO 10/20/18 [phone]

## 2018-10-20 LAB — BASIC METABOLIC PANEL
Anion gap: 12 (ref 5–15)
BUN: 6 mg/dL (ref 6–20)
CALCIUM: 9.4 mg/dL (ref 8.9–10.3)
CO2: 25 mmol/L (ref 22–32)
CREATININE: 0.74 mg/dL (ref 0.44–1.00)
Chloride: 96 mmol/L — ABNORMAL LOW (ref 98–111)
GFR calc non Af Amer: >60 mL/min (ref >60)
GLUCOSE: 85 mg/dL (ref 70–99)
Potassium: 3.6 mmol/L (ref 3.5–5.1)
Sodium: 133 mmol/L — ABNORMAL LOW (ref 135–145)

## 2018-10-20 LAB — URINALYSIS, ROUTINE W REFLEX MICROSCOPIC
BILIRUBIN URINE: NEGATIVE
GLUCOSE, UA: NEGATIVE mg/dL
HGB URINE DIPSTICK: NEGATIVE
KETONES UR: NEGATIVE mg/dL
Leukocytes,Ua: NEGATIVE
NITRITE: NEGATIVE
Protein, ur: NEGATIVE mg/dL
Specific Gravity, Urine: 1.012 (ref 1.005–1.030)
pH: 8 (ref 5.0–8.0)

## 2018-10-20 LAB — POC URINE PREG, ED: Preg Test, Ur: NEGATIVE

## 2018-10-20 NOTE — ED Notes (Signed)
Pt discharged from ED; instructions provided; Pt encouraged to return to ED if symptoms worsen and to f/u with PCP; Pt verbalized understanding of all instructions 

## 2018-10-20 NOTE — Discharge Instructions (Addendum)
1. Medications: usual home medications 2. Treatment: rest, drink plenty of fluids,  3. Follow Up: Please followup with your primary doctor in 3-5 days for discussion of your diagnoses and further evaluation after today's visit; if you do not have a primary care doctor use the resource guide provided to find one; Please return to the ER for any symptoms, high fevers, difficulty breathing or other concerns

## 2019-02-13 ENCOUNTER — Ambulatory Visit
Admission: EM | Admit: 2019-02-13 | Discharge: 2019-02-13 | Disposition: A | Discharge status: Home / Self Care | Payer: HRSA Program | Attending: Emergency Medicine | Admitting: Emergency Medicine

## 2019-02-13 ENCOUNTER — Ambulatory Visit: Payer: Self-pay | Admitting: *Deleted

## 2019-02-13 ENCOUNTER — Other Ambulatory Visit: Payer: Self-pay

## 2019-02-13 ENCOUNTER — Encounter: Payer: Self-pay | Admitting: Emergency Medicine

## 2019-02-13 DIAGNOSIS — J029 Acute pharyngitis, unspecified: Secondary | ICD-10-CM

## 2019-02-13 DIAGNOSIS — Z20828 Contact with and (suspected) exposure to other viral communicable diseases: Secondary | ICD-10-CM

## 2019-02-13 DIAGNOSIS — R059 Cough, unspecified: Secondary | ICD-10-CM

## 2019-02-13 DIAGNOSIS — R05 Cough: Secondary | ICD-10-CM | POA: Diagnosis not present

## 2019-02-13 DIAGNOSIS — F17200 Nicotine dependence, unspecified, uncomplicated: Secondary | ICD-10-CM

## 2019-02-13 DIAGNOSIS — Z20822 Contact with and (suspected) exposure to covid-19: Secondary | ICD-10-CM

## 2019-02-13 MED ORDER — BENZONATATE 100 MG PO CAPS: 100.0000 mg | ORAL_CAPSULE | Freq: Three times a day (TID) | ORAL | 0 refills | Status: DC

## 2019-02-13 MED ORDER — LIDOCAINE VISCOUS HCL 2 % MT SOLN: 15.0000 mL | OROMUCOSAL | 0 refills | Status: DC | PRN

## 2019-02-13 NOTE — ED Notes (Signed)
Patient able to ambulate independently  

## 2019-02-13 NOTE — Telephone Encounter (Signed)
Patient calls with ST/productive cough/fatigue. No fever/no difficulty breathing. Reason for Disposition . Patient sounds very sick or weak to the triager  Answer Assessment - Initial Assessment Questions 1. ONSET: "When did the nasal discharge start?"      Productive cough for 3 days 2. AMOUNT: "How much discharge is there?"     3. COUGH: "Do you have a cough?" If yes, ask: "Describe the color of your sputum" (clear, white, yellow, green)      4. RESPIRATORY DISTRESS: "Describe your breathing."     No difficulties 5. FEVER: "Do you have a fever?" If so, ask: "What is your temperature, how was it measured, and when did it start?"     None known no chills 6. SEVERITY: "Overall, how bad are you feeling right now?" (e.g., doesn't interfere with normal activities, staying home from school/work, staying in bed)      Interferes with work 7. OTHER SYMPTOMS: "Do you have any other symptoms?" (e.g., sore throat, earache, wheezing, vomiting)     Nausea, ST, decreased appetite, fatigue 8. PREGNANCY: "Is there any chance you are pregnant?" "When was your last menstrual period?"  Protocols used: COMMON COLD-A-AH

## 2019-02-13 NOTE — ED Provider Notes (Signed)
[facility]    CSN: [id number] Arrival date & time: 02/13/19  1023     History   Chief Complaint Chief Complaint  Patient presents with  . Cough    HPI Sabrina Le is a 28 y.o. female history of bipolar disorder presenting for 3-day course of productive, non-hemoptic cough, decreased appetite, myalgias, sore throat 1 day course of nasal congestion.  She is endorsing shortness of breath and chest discomfort with prolonged coughing.  No shortness of breath or chest pain at rest.  Patient has not tried anything for her symptoms.  Patient denies known sick contacts, exposure with COVID positive persons, history of strep throat.  Works in food delivery.   Past Medical History:  Diagnosis Date  . Anxiety   . Bipolar disorder (HCC)   . Shoulder dislocation     There are no active problems to display for this patient.   History reviewed. No pertinent surgical history.  OB History   No obstetric history on file.      Home Medications    Prior to Admission medications   Medication Sig Start Date End Date Taking? Authorizing Provider  benzonatate (TESSALON) 100 MG capsule Take 1 capsule (100 mg total) by mouth every 8 (eight) hours. 02/13/19   Hall-Potvin, [place], PA-C  lidocaine (XYLOCAINE) 2 % solution Use as directed 15 mLs in the mouth or throat as needed for mouth pain. 02/13/19   Hall-Potvin, [place], PA-C    Family History History reviewed. No pertinent family history.  Social History Social History   Tobacco Use  . Smoking status: Current Every Day Smoker    Packs/day: 1.00  . Smokeless tobacco: Never Used  Substance Use Topics  . Alcohol use: No    Frequency: Never  . Drug use: Yes    Types: Marijuana     Allergies   Patient has no known allergies.    Review of Systems Review of Systems  Constitutional: Positive for appetite change and fatigue. Negative for fever.  HENT: Positive for congestion, rhinorrhea and sore throat. Negative  for dental problem, ear discharge, ear pain, postnasal drip, sinus pressure, sinus pain, sneezing, tinnitus, trouble swallowing and voice change.   Eyes: Negative for photophobia, pain, discharge, redness, itching and visual disturbance.  Respiratory: Positive for cough and shortness of breath. Negative for chest tightness and wheezing.   Cardiovascular: Negative for chest pain, palpitations and leg swelling.  Gastrointestinal: Negative for abdominal pain, constipation, diarrhea, nausea and vomiting.  Genitourinary: Negative for frequency and urgency.  Musculoskeletal: Negative for arthralgias and myalgias.  Skin: Negative for rash and wound.  Neurological: Negative for dizziness, syncope, weakness and headaches.     Physical Exam Triage Vital Signs ED Triage Vitals  Enc Vitals Group     BP 02/13/19 1055 128/79     Pulse Rate 02/13/19 1055 66     Resp 02/13/19 1055 18     Temp 02/13/19 1055 98.1 F (36.7 C)     Temp Source 02/13/19 1055 Oral     SpO2 02/13/19 1055 98 %     Weight --      Height --      Head Circumference --      Peak Flow --      Pain Score 02/13/19 1052 9     Pain Loc --      Pain Edu? --      Excl. in [place]? --    No data found.  Updated Vital Signs BP 128/79 (  BP Location: Left Arm)   Pulse 66   Temp 98.1 F (36.7 C) (Oral)   Resp 18   LMP 01/22/2019   SpO2 98%    Physical Exam Constitutional:      General: She is not in acute distress.    Appearance: She is normal weight.     Comments: Appears fatigued  HENT:     Head: Normocephalic and atraumatic.     Jaw: There is normal jaw occlusion. No tenderness or pain on movement.     Right Ear: Hearing, tympanic membrane, ear canal and external ear normal. No tenderness. No mastoid tenderness.     Left Ear: Hearing, tympanic membrane, ear canal and external ear normal. No tenderness. No mastoid tenderness.     Nose: Nose normal. No nasal deformity, septal deviation, nasal tenderness, congestion or  rhinorrhea.     Right Turbinates: Not swollen or pale.     Left Turbinates: Not swollen or pale.     Right Sinus: No maxillary sinus tenderness or frontal sinus tenderness.     Left Sinus: No maxillary sinus tenderness or frontal sinus tenderness.     Mouth/Throat:     Lips: Pink. No lesions.     Mouth: Mucous membranes are moist. No injury.     Pharynx: Oropharynx is clear. Uvula midline. Posterior oropharyngeal erythema present. No pharyngeal swelling, oropharyngeal exudate or uvula swelling.     Tonsils: No tonsillar exudate or tonsillar abscesses. 0 on the right. 1+ on the left.  Eyes:     General: No scleral icterus.    Extraocular Movements: Extraocular movements intact.     Conjunctiva/sclera: Conjunctivae normal.     Pupils: Pupils are equal, round, and reactive to light.  Neck:     Musculoskeletal: Normal range of motion and neck supple. No muscular tenderness.  Cardiovascular:     Rate and Rhythm: Normal rate and regular rhythm.  Pulmonary:     Effort: Pulmonary effort is normal. No respiratory distress.     Breath sounds: No wheezing.     Comments: Good air entry bilaterally Abdominal:     General: Abdomen is flat.     Palpations: Abdomen is soft.     Tenderness: There is no abdominal tenderness. There is no right CVA tenderness, left CVA tenderness, guarding or rebound.  Musculoskeletal: Normal range of motion.  Lymphadenopathy:     Cervical: No cervical adenopathy.  Skin:    General: Skin is warm.     Capillary Refill: Capillary refill takes less than 2 seconds.     Coloration: Skin is not jaundiced or pale.     Findings: No bruising, erythema or rash.  Neurological:     General: No focal deficit present.     Mental Status: She is alert and oriented to person, place, and time.  Psychiatric:        Thought Content: Thought content normal.        Judgment: Judgment normal.      UC Treatments / Results  Labs (all labs ordered are listed, but only abnormal  results are displayed) Labs Reviewed  NOVEL CORONAVIRUS, NAA    EKG   Radiology No results found.  Procedures Procedures (including critical care time)  Medications Ordered in UC Medications - No data to display  Initial Impression / Assessment and Plan / UC Course  I have reviewed the triage vital signs and the nursing notes.  Pertinent labs & imaging results that were available during my care of the  patient were reviewed by me and considered in my medical decision making (see chart for details).     1.  Suspect COVID-19 infection COVID test pending, patient is self isolate until results are back.  Work note provided.  2.  Sore throat Patient does not meet [facility] criteria for streptococcal testing at this time.  Physical exam reassuring, likely viral pharyngitis so will treat supportively, provide viscous lidocaine for additional relief.  3.  Cough Non-hemoptic, O2 saturation 98%: Radiography deferred due duration of symptoms.  Patient did take Tessalon pearls as prescribed.  Return precautions discussed, patient verbalized understanding and is agreeable to plan.  Final Clinical Impressions(s) / UC Diagnoses   Final diagnoses:  Suspected Covid-19 Virus Infection  Sore throat  Cough     Discharge Instructions     We will call you with COVID test results. Important to drink water, take tylenol for pain/fever, and tessalon pearls as needed for cough.  Return for worsening cough, SOB, chest pain, fever, body aches.    ED Prescriptions    Medication Sig Dispense Auth. Provider   benzonatate (TESSALON) 100 MG capsule Take 1 capsule (100 mg total) by mouth every 8 (eight) hours. 21 capsule Hall-Potvin, [place], PA-C   lidocaine (XYLOCAINE) 2 % solution Use as directed 15 mLs in the mouth or throat as needed for mouth pain. 100 mL Hall-Potvin, [place], PA-C     Controlled Substance Prescriptions [place] Controlled Substance Registry consulted? Not Applicable    Shea EvansHall-Potvin, Brittany, [place]-C 02/13/19 1215

## 2019-02-13 NOTE — ED Triage Notes (Signed)
Pt presents to [facility] for assessment of 3 days  Of cough, congestion, abdominal pain (patient drank alcohol last night).  Pt states that her throat hurts so bad she does not want to eat.  Patient c/o severe body aches, shortness of breath and "uncontrollable" cough.  Pt c/o chest tightness and dizziness with coughing.  Pt c/o headache starting today.

## 2019-02-13 NOTE — Discharge Instructions (Addendum)
We will call you with COVID test results. Important to drink water, take tylenol for pain/fever, and tessalon pearls as needed for cough.  Return for worsening cough, SOB, chest pain, fever, body aches.

## 2019-02-15 LAB — NOVEL CORONAVIRUS, NAA: SARS-CoV-2, NAA: DETECTED — AB

## 2019-02-16 ENCOUNTER — Telehealth (HOSPITAL_COMMUNITY): Payer: Self-pay | Admitting: Emergency Medicine

## 2019-02-16 ENCOUNTER — Encounter (HOSPITAL_COMMUNITY): Payer: Self-pay

## 2019-02-16 NOTE — Telephone Encounter (Signed)
Your test for COVID-19 was positive, meaning that you were infected with the novel coronavirus and could give the germ to others.  Please continue isolation at home, for at least 10 days since the start of your fever/cough/breathlessness and until you have had 3 consecutive days without fever (without taking a fever reducer) and with cough/breathlessness improving. Please continue good preventive care measures, including:  frequent hand-washing, avoid touching your face, cover coughs/sneezes, stay out of crowds and keep a 6 foot distance from others.  Recheck or go to the nearest hospital ED tent for re-assessment if fever/cough/breathlessness return.  Patient contacted and made aware of all results, all questions answered.   

## 2019-02-26 ENCOUNTER — Ambulatory Visit
Admission: EM | Admit: 2019-02-26 | Discharge: 2019-02-26 | Disposition: A | Discharge status: Home / Self Care | Payer: HRSA Program | Attending: Emergency Medicine | Admitting: Emergency Medicine

## 2019-02-26 ENCOUNTER — Other Ambulatory Visit: Payer: Self-pay

## 2019-02-26 DIAGNOSIS — U071 COVID-19: Secondary | ICD-10-CM

## 2019-02-26 NOTE — ED Provider Notes (Signed)
[facility]    CSN: [id number] Arrival date & time: 02/26/19  1512     History   Chief Complaint Chief Complaint  Patient presents with  . need COVID testing    HPI Sabrina Le is a 28 y.o. female presenting for repeat cover testing.  Patient tested positive on 7/23 at this office.  Has quarantined at home and been asymptomatic for about 5 days now: Did not seek medical care since diagnosis.  Patient denies fever, chills, cough, shortness of breath.    Past Medical History:  Diagnosis Date  . Anxiety   . Bipolar disorder ([place])   . Shoulder dislocation     There are no active problems to display for this patient.   History reviewed. No pertinent surgical history.  OB History   No obstetric history on file.      Home Medications    Prior to Admission medications   Medication Sig Start Date End Date Taking? Authorizing Provider  benzonatate (TESSALON) 100 MG capsule Take 1 capsule (100 mg total) by mouth every 8 (eight) hours. 02/13/19   Hall-Potvin, [place], PA-C  lidocaine (XYLOCAINE) 2 % solution Use as directed 15 mLs in the mouth or throat as needed for mouth pain. 02/13/19   Hall-Potvin, [place], PA-C    Family History No family history on file.  Social History Social History   Tobacco Use  . Smoking status: Current Every Day Smoker    Packs/day: 1.00  . Smokeless tobacco: Never Used  Substance Use Topics  . Alcohol use: No    Frequency: Never  . Drug use: Yes    Types: Marijuana     Allergies   Patient has no known allergies.   Review of Systems Review of Systems  Constitutional: Negative for fatigue and fever.  HENT: Negative for ear pain, sinus pain, sore throat and voice change.   Eyes: Negative for pain, redness and visual disturbance.  Respiratory: Negative for cough and shortness of breath.   Cardiovascular: Negative for chest pain and palpitations.  Gastrointestinal: Negative for abdominal pain, diarrhea and  vomiting.  Musculoskeletal: Negative for arthralgias and myalgias.  Skin: Negative for rash and wound.  Neurological: Negative for syncope and headaches.     Physical Exam Triage Vital Signs ED Triage Vitals [02/26/19 1550]  Enc Vitals Group     BP 127/87     Pulse Rate 66     Resp 18     Temp 97.8 F (36.6 C)     Temp Source Oral     SpO2 98 %     Weight      Height      Head Circumference      Peak Flow      Pain Score 0     Pain Loc      Pain Edu?      Excl. in [place]?    No data found.  Updated Vital Signs BP 127/87 (BP Location: Left Arm)   Pulse 66   Temp 97.8 F (36.6 C) (Oral)   Resp 18   SpO2 98%   Visual Acuity Right Eye Distance:   Left Eye Distance:   Bilateral Distance:    Right Eye Near:   Left Eye Near:    Bilateral Near:     Physical Exam Constitutional:      General: She is not in acute distress. HENT:     Head: Normocephalic and atraumatic.  Eyes:     General: No scleral  icterus.    Pupils: Pupils are equal, round, and reactive to light.  Cardiovascular:     Rate and Rhythm: Normal rate.  Pulmonary:     Effort: Pulmonary effort is normal.  Skin:    Coloration: Skin is not jaundiced or pale.  Neurological:     Mental Status: She is alert and oriented to person, place, and time.      UC Treatments / Results  Labs (all labs ordered are listed, but only abnormal results are displayed) Labs Reviewed  NOVEL CORONAVIRUS, NAA    EKG   Radiology No results found.  Procedures Procedures (including critical care time)  Medications Ordered in UC Medications - No data to display  Initial Impression / Assessment and Plan / UC Course  I have reviewed the triage vital signs and the nursing notes.  Pertinent labs & imaging results that were available during my care of the patient were reviewed by me and considered in my medical decision making (see chart for details).     1.  COVID-19 viral infection Patient currently  asymptomatic x 5 days, afebrile greater than that.  Repeat covid test pending.  Patient preferring to isolate until results are back "just to be safe ".  Discussed CDC guidelines for return to work.  Return precautions discussed, patient verbalized understanding and is agreeable to plan. Final Clinical Impressions(s) / UC Diagnoses   Final diagnoses:  COVID-19 virus infection     Discharge Instructions     Covid test pending. May return to work if he remained afebrile, asymptomatic and your test is negative. Return if you develop fever, cough, shortness of breath.    ED Prescriptions    None     Controlled Substance Prescriptions [place] Controlled Substance Registry consulted? Not Applicable   Shea EvansHall-Potvin, Brittany, [place]PA-C 02/26/19 1612

## 2019-02-26 NOTE — Discharge Instructions (Signed)
Covid test pending. May return to work if he remained afebrile, asymptomatic and your test is negative. Return if you develop fever, cough, shortness of breath.

## 2019-02-26 NOTE — ED Triage Notes (Signed)
Pt states she is feel much better, had a positive COVID 19 13 days ago, needs to be tested again so she can return to work

## 2019-02-27 LAB — NOVEL CORONAVIRUS, NAA: SARS-CoV-2, NAA: NOT DETECTED

## 2019-02-28 ENCOUNTER — Encounter (HOSPITAL_COMMUNITY): Payer: Self-pay

## 2019-03-06 ENCOUNTER — Ambulatory Visit: Admission: EM | Admit: 2019-03-06 | Discharge: 2019-03-06 | Discharge status: Absent without leave | Payer: MEDICAID

## 2019-09-01 ENCOUNTER — Emergency Department (HOSPITAL_COMMUNITY): Payer: Self-pay

## 2019-09-01 ENCOUNTER — Encounter (HOSPITAL_COMMUNITY): Payer: Self-pay | Admitting: Emergency Medicine

## 2019-09-01 ENCOUNTER — Other Ambulatory Visit: Payer: Self-pay

## 2019-09-01 ENCOUNTER — Emergency Department (HOSPITAL_COMMUNITY)
Admission: EM | Admit: 2019-09-01 | Discharge: 2019-09-01 | Disposition: A | Discharge status: Home / Self Care | Payer: Self-pay | Attending: Emergency Medicine | Admitting: Emergency Medicine

## 2019-09-01 DIAGNOSIS — S43004A Unspecified dislocation of right shoulder joint, initial encounter: Secondary | ICD-10-CM

## 2019-09-01 DIAGNOSIS — M24411 Recurrent dislocation, right shoulder: Secondary | ICD-10-CM | POA: Insufficient documentation

## 2019-09-01 DIAGNOSIS — F17210 Nicotine dependence, cigarettes, uncomplicated: Secondary | ICD-10-CM | POA: Insufficient documentation

## 2019-09-01 MED ORDER — FENTANYL CITRATE (PF) 100 MCG/2ML IJ SOLN
50.0000 ug | Freq: Once | INTRAMUSCULAR | Status: AC
  Administered 2019-09-01: 50 ug via INTRAVENOUS
  Filled 2019-09-01: qty 2

## 2019-09-01 MED ORDER — PROPOFOL 10 MG/ML IV BOLUS
INTRAVENOUS | Status: AC | PRN
  Administered 2019-09-01 (×3): 40 mg via INTRAVENOUS

## 2019-09-01 MED ORDER — PROPOFOL 10 MG/ML IV BOLUS
1.0000 mg/kg | Freq: Once | INTRAVENOUS | Status: AC
  Administered 2019-09-01: 40 mg via INTRAVENOUS
  Filled 2019-09-01: qty 20

## 2019-09-01 NOTE — ED Triage Notes (Signed)
Per [facility] Pt from home for right shoulder dislocation that happened while she was sleeping. Pt reports that she is normally able to get back in place herself but this morning wasn't able to.

## 2019-09-01 NOTE — ED Notes (Signed)
X-ray at bedside

## 2019-09-01 NOTE — ED Provider Notes (Signed)
[facility] DEPT Provider Note   CSN: [id number] Arrival date & time: 09/01/19  0710     History Chief Complaint  Patient presents with  . Shoulder Pain    [name] is a 29 y.o. female with a history of anxiety, bipolar disorder, and recurrent shoulder dislocation who presents to the emergency department with complaints of acute onset right shoulder pain that began just prior to arrival.  Patient states that she was having a dream, rolled over in bed, and had acute onset pain to the right shoulder.  She states she felt like it popped out of place.  This feels similar to prior shoulder dislocations.  She was unable to get it back in by herself prompting ER visit.  She states the pain is severe, worse with attempts of movement, no alleviating factors.  Denies numbness, tingling, or weakness.  Denies other areas of injury.  She states that she has dislocated her shoulder about 12 times in the past 2 years.  HPI     Past Medical History:  Diagnosis Date  . Anxiety   . Bipolar disorder ([place])   . Shoulder dislocation     There are no problems to display for this patient.   History reviewed. No pertinent surgical history.   OB History   No obstetric history on file.     No family history on file.  Social History   Tobacco Use  . Smoking status: Current Every Day Smoker    Packs/day: 1.00  . Smokeless tobacco: Never Used  Substance Use Topics  . Alcohol use: No  . Drug use: Yes    Types: Marijuana    Home Medications Prior to Admission medications   Medication Sig Start Date End Date Taking? Authorizing Provider  benzonatate (TESSALON) 100 MG capsule Take 1 capsule (100 mg total) by mouth every 8 (eight) hours. Patient not taking: Reported on 09/01/2019 02/13/19   Hall-Potvin, [place], PA-C  lidocaine (XYLOCAINE) 2 % solution Use as directed 15 mLs in the mouth or throat as needed for mouth pain. Patient not taking: Reported on 09/01/2019  02/13/19   Hall-Potvin, [place], PA-C    Allergies    Patient has no known allergies.  Review of Systems   Review of Systems  Constitutional: Negative for chills and fever.  Respiratory: Negative for shortness of breath.   Cardiovascular: Negative for chest pain.  Gastrointestinal: Negative for abdominal pain, nausea and vomiting.  Musculoskeletal: Positive for arthralgias.  Skin: Negative for wound.  Neurological: Negative for weakness and numbness.  All other systems reviewed and are negative.   Physical Exam Updated Vital Signs BP (!) 151/98 (BP Location: Left Arm)   Pulse 66   Temp 98.1 F (36.7 C) (Oral)   Resp 19   SpO2 97%   Physical Exam Vitals and nursing note reviewed.  Constitutional:      General: She is in acute distress (appears uncomfortable).     Appearance: Normal appearance. She is well-developed. She is not ill-appearing or toxic-appearing.  HENT:     Head: Normocephalic and atraumatic.  Eyes:     General:        Right eye: No discharge.        Left eye: No discharge.     Conjunctiva/sclera: Conjunctivae normal.  Neck:     Comments: No midline tenderness.  Cardiovascular:     Rate and Rhythm: Normal rate and regular rhythm.     Pulses:  Radial pulses are 2+ on the right side and 2+ on the left side.  Pulmonary:     Effort: Pulmonary effort is normal. No respiratory distress.     Breath sounds: Normal breath sounds. No wheezing, rhonchi or rales.  Abdominal:     General: There is no distension.     Palpations: Abdomen is soft.     Tenderness: There is no abdominal tenderness.  Musculoskeletal:     Cervical back: Normal range of motion and neck supple.     Comments: Upper extremities: Obvious deformity to right shoulder.  No open wounds.  No ecchymosis.  No erythema/warmth.  Patient has intact active range of motion throughout the upper extremities with the exception of very limited range of motion of the right shoulder.  Patient is  tender to palpation to the right glenohumeral joint.  Upper extremities are otherwise nontender.  Skin:    General: Skin is warm and dry.     Capillary Refill: Capillary refill takes less than 2 seconds.     Findings: No rash.  Neurological:     Mental Status: She is alert.     Comments: Alert. Clear speech. Sensation grossly intact to bilateral upper extremities. 5/5 symmetric grip strength. Ambulatory.   Psychiatric:        Mood and Affect: Mood normal.        Behavior: Behavior normal.     ED Results / Procedures / Treatments   Labs (all labs ordered are listed, but only abnormal results are displayed) Labs Reviewed - No data to display  EKG None  Radiology DG Shoulder Right  Result Date: 09/01/2019 CLINICAL DATA:  Pain.  History of shoulder dislocation EXAM: RIGHT SHOULDER - 2+ VIEW COMPARISON:  March 11, 2017. FINDINGS: Oblique and Y scapular images obtained. There is a subcoracoid anterior dislocation. No fracture evident. No joint space narrowing or erosion. Right lung clear. IMPRESSION: Subcoracoid anterior dislocation. No fracture evident. No appreciable arthropathy. Electronically Signed   By: Bretta Bang III M.D.   On: 09/01/2019 07:46    Procedures .Sedation  Date/Time: 09/01/2019 9:06 AM Performed by: Geoffery Lyons, MD Authorized by: Geoffery Lyons, MD   Consent:    Consent obtained:  Verbal   Consent given by:  Patient   Risks discussed:  Allergic reaction, dysrhythmia, inadequate sedation, nausea, prolonged hypoxia resulting in organ damage, prolonged sedation necessitating reversal, respiratory compromise necessitating ventilatory assistance and intubation and vomiting   Alternatives discussed:  Analgesia without sedation, anxiolysis and regional anesthesia Universal protocol:    Procedure explained and questions answered to patient or proxy's satisfaction: yes     Relevant documents present and verified: yes     Test results available and properly  labeled: yes     Imaging studies available: yes     Required blood products, implants, devices, and special equipment available: yes     Site/side marked: yes     Immediately prior to procedure a time out was called: yes     Patient identity confirmation method:  Verbally with patient Indications:    Procedure performed:  Dislocation reduction   Procedure necessitating sedation performed by:  Physician performing sedation Pre-sedation assessment:    Time since last food or drink:  Last night   ASA classification: class 1 - normal, healthy patient     Neck mobility: normal     Mouth opening:  3 or more finger widths   Thyromental distance:  4 finger widths   Mallampati score:  I -  soft palate, uvula, fauces, pillars visible   Pre-sedation assessments completed and reviewed: airway patency, cardiovascular function, hydration status, mental status, nausea/vomiting, pain level, respiratory function and temperature   Immediate pre-procedure details:    Reassessment: Patient reassessed immediately prior to procedure     Reviewed: vital signs, relevant labs/tests and NPO status     Verified: bag valve mask available, emergency equipment available, intubation equipment available, IV patency confirmed, oxygen available and suction available   Procedure details (see MAR for exact dosages):    Preoxygenation:  Nasal cannula   Sedation:  Propofol   Intended level of sedation: deep   Analgesia:  Fentanyl   Intra-procedure monitoring:  Blood pressure monitoring, cardiac monitor, continuous pulse oximetry, frequent LOC assessments, frequent vital sign checks and continuous capnometry   Intra-procedure events: none     Total Provider sedation time (minutes):  10 Post-procedure details:    Attendance: Constant attendance by certified staff until patient recovered     Recovery: Patient returned to pre-procedure baseline     Post-sedation assessments completed and reviewed: airway patency,  cardiovascular function, hydration status, mental status, nausea/vomiting, pain level, respiratory function and temperature     Patient is stable for discharge or admission: yes     Patient tolerance:  Tolerated well, no immediate complications Reduction of dislocation  Date/Time: 09/01/2019 9:08 AM Performed by: Cherly Anderson, PA-C Authorized by: Cherly Anderson, PA-C  Consent: Verbal consent obtained. Risks and benefits: risks, benefits and alternatives were discussed Consent given by: patient Patient understanding: patient states understanding of the procedure being performed Patient consent: the patient's understanding of the procedure matches consent given Procedure consent: procedure consent matches procedure scheduled Relevant documents: relevant documents present and verified Test results: test results available and properly labeled Site marked: the operative site was marked Imaging studies: imaging studies available Required items: required blood products, implants, devices, and special equipment available Patient identity confirmed: verbally with patient Time out: Immediately prior to procedure a "time out" was called to verify the correct patient, procedure, equipment, support staff and site/side marked as required. Local anesthesia used: no  Anesthesia: Local anesthesia used: no  Sedation: Patient sedated: yes Sedatives: propofol Analgesia: fentanyl Vitals: Vital signs were monitored during sedation.  Patient tolerance: patient tolerated the procedure well with no immediate complications Comments: Sedation & reduction performed in conjunction with supervising physician Dr. Marda Stalker APPLICATION Date/Time: 9:10 AM Authorized by: Harvie Heck Consent: Verbal consent obtained. Risks and benefits: risks, benefits and alternatives were discussed Consent given by: patient Splint applied by: Myself & RN Location details: LUE Splint type:  shoulder sling immobilizer  Supplies used: shoulder sling immobilizer.  Post-procedure: The splinted body part was neurovascularly unchanged following the procedure. Patient tolerance: Patient tolerated the procedure well with no immediate complications.   (including critical care time)  Medications Ordered in ED Medications  propofol (DIPRIVAN) 10 mg/mL bolus/IV push (40 mg Intravenous Given 09/01/19 0849)  fentaNYL (SUBLIMAZE) injection 50 mcg (50 mcg Intravenous Given 09/01/19 0746)  propofol (DIPRIVAN) 10 mg/mL bolus/IV push 90 mg (40 mg Intravenous Given 09/01/19 0841)    ED Course  I have reviewed the triage vital signs and the nursing notes.  Pertinent labs & imaging results that were available during my care of the patient were reviewed by me and considered in my medical decision making (see chart for details).    MDM Rules/Calculators/A&P  Patient presents to the emergency department with acute onset right shoulder pain which began shortly prior to arrival..  Obvious deformity noted with limited range of motion, and tenderness over the glenohumeral joint..  Neurovascular intact distally.  X-ray reveals subcoracoid anterior dislocation.  Conscious sedation with reduction performed supervising physician Dr. Judd Lien.  Repeat x-ray reveals interval reduction.  Patient remains neurovascular intact distally.  She is placed in a sling immobilizer.  Observed in the emergency department, feeling much better, tolerating p.o., and ready for discharge. I discussed results, treatment plan, need for follow-up with orthopedics, and return precautions with the patient. Provided opportunity for questions, patient confirmed understanding and is in agreement with plan.    Final Clinical Impression(s) / ED Diagnoses Final diagnoses:  Dislocation of right shoulder joint, initial encounter    Rx / DC Orders ED Discharge Orders    None       Cherly Anderson, PA-C 09/01/19  1143    Geoffery Lyons, MD 09/01/19 1343

## 2019-09-01 NOTE — Discharge Instructions (Addendum)
You were seen in the emergency department today after a shoulder dislocation.  Your shoulder dislocation was reduced.  Please stay in the sling until you have followed up with orthopedics.  Please call for an appointment within the next 1 week for follow-up.  Take Motrin/Tylenol per over-the-counter dosing to help with discomfort. Do not drive or operate heavy machinery for the rest today given you were given medicine to make you sleepy.  Return to the emergency department for new or worsening symptoms including but not limited to recurrence of shoulder dislocation, increased pain, numbness, tingling, weakness, passing out, chest pain, trouble breathing, or any other concerns.

## 2019-09-18 ENCOUNTER — Encounter (HOSPITAL_COMMUNITY): Payer: Self-pay | Admitting: Emergency Medicine

## 2019-09-18 ENCOUNTER — Emergency Department (HOSPITAL_COMMUNITY): Payer: Self-pay

## 2019-09-18 ENCOUNTER — Other Ambulatory Visit: Payer: Self-pay

## 2019-09-18 ENCOUNTER — Emergency Department (HOSPITAL_COMMUNITY)
Admission: EM | Admit: 2019-09-18 | Discharge: 2019-09-18 | Disposition: A | Discharge status: Home / Self Care | Payer: Self-pay | Attending: Emergency Medicine | Admitting: Emergency Medicine

## 2019-09-18 DIAGNOSIS — F17210 Nicotine dependence, cigarettes, uncomplicated: Secondary | ICD-10-CM | POA: Insufficient documentation

## 2019-09-18 DIAGNOSIS — Y92009 Unspecified place in unspecified non-institutional (private) residence as the place of occurrence of the external cause: Secondary | ICD-10-CM | POA: Insufficient documentation

## 2019-09-18 DIAGNOSIS — Y999 Unspecified external cause status: Secondary | ICD-10-CM | POA: Insufficient documentation

## 2019-09-18 DIAGNOSIS — Y9384 Activity, sleeping: Secondary | ICD-10-CM | POA: Insufficient documentation

## 2019-09-18 DIAGNOSIS — S43014A Anterior dislocation of right humerus, initial encounter: Secondary | ICD-10-CM | POA: Insufficient documentation

## 2019-09-18 DIAGNOSIS — X58XXXA Exposure to other specified factors, initial encounter: Secondary | ICD-10-CM | POA: Insufficient documentation

## 2019-09-18 MED ORDER — OXYCODONE-ACETAMINOPHEN 5-325 MG PO TABS
2.0000 | ORAL_TABLET | Freq: Once | ORAL | Status: DC
  Filled 2019-09-18: qty 2

## 2019-09-18 MED ORDER — PROPOFOL 10 MG/ML IV BOLUS
0.5000 mg/kg | Freq: Once | INTRAVENOUS | Status: AC
  Administered 2019-09-18: 10:00:00 47.7 mg via INTRAVENOUS
  Filled 2019-09-18: qty 20

## 2019-09-18 MED ORDER — PROPOFOL 10 MG/ML IV BOLUS
INTRAVENOUS | Status: AC | PRN
  Administered 2019-09-18 (×3): 25 mg via INTRAVENOUS
  Administered 2019-09-18: 50 mg via INTRAVENOUS

## 2019-09-18 NOTE — ED Notes (Addendum)
Discharge paperwork reviewed with pt, including follow up care.  Pt AOx4, on room air, ambulatory at discharge. Pt tolerating PO intake at time of discharge, no questions or concerns verbalized prior to departure. Pt ordered a [employer] for transportation home.

## 2019-09-18 NOTE — ED Triage Notes (Addendum)
Pt BIBA from home.   Per EMS- Pt reports right shoulder dislocation- hx of shoulder/rotator cuff injury. Pt reports dislocation happened while sleeping.   Pt received 50 mcg IN fentanyl at 0830.

## 2019-09-18 NOTE — ED Notes (Signed)
X-ray at bedside

## 2019-09-18 NOTE — Discharge Instructions (Addendum)
Keep immobilizer in place Use ibuprofen and cold therapy for pain

## 2019-09-18 NOTE — ED Provider Notes (Signed)
[facility]-EMERGENCY DEPT Provider Note   CSN: [id number] Arrival date & time: 09/18/19  6468     History Chief Complaint  Patient presents with  . Shoulder Pain    Sabrina Le is a 28 y.o. female.  HPI    29 yo female complaining of right shoulder pain.  History of same- reports no trauma, but awoke "this way"-  with pain and deformity. She states whenever she accidentally extends arm in sleep, it dislocates.  She does not currently have an orthopedist.  She reports that this is happened greater than 20 times.  Reports that she is not originally from [place].  She denies pregnancy.  She is also complaining of anxiety surrounding pain with this event Review of records reveals deficit for spontaneous shoulder dislocation in December 2018.  At that time, she received pain medicine outpatient reporting that her shoulder had spontaneously reduced. Past Medical History:  Diagnosis Date  . Anxiety   . Bipolar disorder ([facility])   . Shoulder dislocation     There are no problems to display for this patient.   History reviewed. No pertinent surgical history.   OB History   No obstetric history on file.     History reviewed. No pertinent family history.  Social History   Tobacco Use  . Smoking status: Current Every Day Smoker    Packs/day: 1.00  . Smokeless tobacco: Never Used  Substance Use Topics  . Alcohol use: No  . Drug use: Yes    Types: Marijuana    Home Medications Prior to Admission medications   Not on File    Allergies    Patient has no known allergies.  Review of Systems   Review of Systems  All other systems reviewed and are negative.   Physical Exam Updated Vital Signs SpO2 97%   Physical Exam Vitals and nursing note reviewed.  Constitutional:      Appearance: Normal appearance.  HENT:     Head: Normocephalic.     Right Ear: External ear normal.     Left Ear: External ear normal.     Nose: Nose normal.   Mouth/Throat:     Mouth: Mucous membranes are moist.  Eyes:     Extraocular Movements: Extraocular movements intact.     Pupils: Pupils are equal, round, and reactive to light.  Cardiovascular:     Rate and Rhythm: Normal rate.     Pulses: Normal pulses.  Pulmonary:     Effort: Pulmonary effort is normal.     Breath sounds: Normal breath sounds.  Abdominal:     Palpations: Abdomen is soft.  Musculoskeletal:        General: Deformity present. No swelling or tenderness.     Comments: Right shoulder deformity c.w. dislocation Attempted to palpate humeral head- patient complains of severe pain with palpation to humeral head and right elbow Radial pulse intact Sensation intact  Skin:    General: Skin is warm and dry.  Neurological:     General: No focal deficit present.     Mental Status: She is alert.     ED Results / Procedures / Treatments   Labs (all labs ordered are listed, but only abnormal results are displayed) Labs Reviewed - No data to display  EKG None  Radiology DG Shoulder Right Portable  Result Date: 09/18/2019 CLINICAL DATA:  Dislocation of the shoulder EXAM: PORTABLE RIGHT SHOULDER COMPARISON:  09/01/2019 FINDINGS: Anterior glenohumeral dislocation without visible fracture. IMPRESSION: Anterior glenohumeral dislocation. Electronically  Signed   By: Monte Fantasia M.D.   On: 09/18/2019 10:13    Procedures .Sedation  Date/Time: 09/18/2019 10:41 AM Performed by: Pattricia Boss, MD Authorized by: Pattricia Boss, MD   Consent:    Consent obtained:  Verbal and written   Consent given by:  Patient   Risks discussed:  Allergic reaction, dysrhythmia, inadequate sedation, nausea, prolonged hypoxia resulting in organ damage, prolonged sedation necessitating reversal, respiratory compromise necessitating ventilatory assistance and intubation and vomiting   Alternatives discussed:  Analgesia without sedation, anxiolysis and regional anesthesia Universal protocol:     Procedure explained and questions answered to patient or proxy's satisfaction: yes     Relevant documents present and verified: yes     Test results available and properly labeled: yes     Imaging studies available: yes     Required blood products, implants, devices, and special equipment available: yes     Site/side marked: yes     Immediately prior to procedure a time out was called: yes     Patient identity confirmation method:  Verbally with patient Indications:    Procedure necessitating sedation performed by:  Physician performing sedation Pre-sedation assessment:    Time since last food or drink:  3   ASA classification: class 1 - normal, healthy patient     Neck mobility: normal     Mouth opening:  3 or more finger widths   Thyromental distance:  4 finger widths   Mallampati score:  I - soft palate, uvula, fauces, pillars visible   Pre-sedation assessments completed and reviewed: airway patency, cardiovascular function, hydration status, mental status, nausea/vomiting, pain level, respiratory function and temperature   Immediate pre-procedure details:    Reassessment: Patient reassessed immediately prior to procedure     Reviewed: vital signs, relevant labs/tests and NPO status     Verified: bag valve mask available, emergency equipment available, intubation equipment available, IV patency confirmed, oxygen available and suction available   Procedure details (see MAR for exact dosages):    Preoxygenation:  Nasal cannula   Sedation:  Propofol   Intended level of sedation: deep   Intra-procedure monitoring:  Blood pressure monitoring, cardiac monitor, continuous pulse oximetry, frequent LOC assessments, frequent vital sign checks and continuous capnometry   Intra-procedure events: none     Total Provider sedation time (minutes):  15 Post-procedure details:    Post-sedation assessment completed:  09/18/2019 10:41 AM   Attendance: Constant attendance by certified staff until patient  recovered     Recovery: Patient returned to pre-procedure baseline     Post-sedation assessments completed and reviewed: airway patency, cardiovascular function, hydration status, mental status, nausea/vomiting, pain level, respiratory function and temperature     Patient is stable for discharge or admission: yes     Patient tolerance:  Tolerated well, no immediate complications  Reduction of dislocation  Date/Time: 09/18/2019 10:42 AM Performed by: Pattricia Boss, MD Authorized by: Pattricia Boss, MD  Consent: Verbal consent obtained. Written consent obtained. Risks and benefits: risks, benefits and alternatives were discussed Local anesthesia used: no  Anesthesia: Local anesthesia used: no  Sedation: Patient sedated: yes Sedatives: propofol Sedation start date/time: 09/18/2019 10:28 AM Sedation end date/time: 09/18/2019 10:42 AM     (including critical care time)  Medications Ordered in ED Medications - No data to display  ED Course  I have reviewed the triage vital signs and the nursing notes.  Pertinent labs & imaging results that were available during my care of the patient were reviewed  by me and considered in my medical decision making (see chart for details). Patient permitted for sedation and reduction of joint She is advised regarding risk of airway obstruction, cardiac arrest, inability to reduce joint, and fracture of the joint.     MDM Rules/Calculators/A&P                     Final Clinical Impression(s) / ED Diagnoses Final diagnoses:  Anterior dislocation of right shoulder, initial encounter    Rx / DC Orders ED Discharge Orders    None       Margarita Grizzle, MD 09/18/19 1545

## 2021-09-20 NOTE — ED Triage Notes (Signed)
Formatting of this note might be different from the original.  Patient arrived via EMS c/o right shoulder pain. Patient stated she was stretching and started feeling pain.   Electronically signed by Jethro Poling, LPN at [facility]  [facility] PM EST

## 2021-09-20 NOTE — ED Provider Notes (Signed)
Formatting of this note is different from the original.    [place]    Time of Arrival:   09/20/21 2047    Final diagnoses:   [id number] Closed dislocation of right shoulder, initial encounter (Primary)     Medical Decision Making:      Differential Diagnosis:   dislocation vs sprain vs sublux    Social Determinants of Health:  social factors reviewed, did not limit treatment                               Supplemental Historians include:  patient    ED Course:            ED Course as of 09/20/21 2151   Tue Sep 20, 2021   2123 Pt unable to tolerate exam.  Will get imaging and the  proceed. [RB]   2140 Shoulder auto reduced with xray positioning, will swath and sling, f/u with ortho [RB]         Documentation/Prior Results Review:  Nursing notes    Rhythm interpretation from monitor: N/A    Imaging Interpreted by me: Not Applicable    SHOULDER COMPLETE RT    (Results Pending)     .     Discussion of Mangement with other Physicians, QHP or Appropriate Source:   None    .    Disposition:  Home    New Prescriptions    IBUPROFEN (ADVIL;MOTRIN) 800 MG PO TABS    Take 1 Tab by Mouth Every 6 Hours As Needed.     Chief Complaint   Patient presents with    SHOULDER PAIN     31 y/o female was stretching when she developed pain in right shoulder.  No other injuries.  Hurts to move shoulder.  No home treatment , arrived by EMS.    History provided by:  Patient  Language interpreter used: No    SHOULDER PAIN  This is a new problem. The current episode started today. The problem occurs constantly. The problem is unchanged. The pain occurs in the context of an injury. The pain is present in the right side. Site of pain is localized in muscle and a joint. The pain is Different from prior episodes. The pain is medium. Nothing relieves the symptoms. The symptoms are not relieved by nothing. The symptoms are aggravated by movement. Associated symptoms include joint swelling.      Review of  Systems:  Constitutional: negative.    HENT: negative.     Endocrine: negative.   Respiratory: negative.     Cardiovascular: negative.    Gastrointestinal: negative.    Genitourinary: negative.    Musculoskeletal:  Positive for myalgias and joint swelling.   Neurological: negative.    Psychiatric/Behavioral: negative.       Physical Exam  Vitals and nursing note reviewed.   Constitutional:       Appearance: Normal appearance. She is obese.   HENT:      Head: Normocephalic and atraumatic.   Pulmonary:      Effort: Pulmonary effort is normal.      Breath sounds: Normal breath sounds.   Musculoskeletal:      Right shoulder: Deformity (anterior bulge) and tenderness present. Decreased range of motion (in any plane).      Left shoulder: Normal.      Cervical back: Normal range of motion and neck supple.   Skin:  General: Skin is warm.      Capillary Refill: Capillary refill takes less than 2 seconds.   Neurological:      General: No focal deficit present.      Mental Status: She is alert and oriented to person, place, and time.     Past Medical History:   Diagnosis Date    Back pain     Bipolar disorder ([place])     Depression      History reviewed. No pertinent surgical history.  History reviewed. No pertinent family history.  Social History     Occupational History    Not on file   Tobacco Use    Smoking status: Every Day     Packs/day: 0.50     Types: Cigarettes    Smokeless tobacco: Not on file   Substance and Sexual Activity    Alcohol use: No    Drug use: No     Comment: marijuana    Sexual activity: Not on file     Outpatient Medications Marked as Taking for the 09/20/21 encounter [facility] Encounter)   Medication Sig Dispense Refill    ibuprofen (ADVIL;MOTRIN) 800 mg PO TABS Take 1 Tab by Mouth Every 6 Hours As Needed. 60 Tab 0     No Known Allergies    Vital Signs:  Patient Vitals for the past 72 hrs:   Temp Heart Rate Resp BP BP Mean SpO2   09/20/21 2051 97.9 F (36.6 C) 66 18 144/79 101 MM HG 99 %      Diagnostics:  Labs:  No results found for this visit on 09/20/21.  ECG:  No results found for this visit on 09/20/21.    Medications ordered/given in the ED  Medications - No data to display      Electronically signed by Arletha Grippe, MD at 09/20/2021 11:42 PM EST

## 2021-09-20 NOTE — ED Notes (Signed)
Formatting of this note might be different from the original.  Pain assessment on discharge was better.  Condition stable.  Patient discharged to home.  Patient education was completed:  yes  Education taught to:  patient  Teaching method used was discussion.  Understanding of teaching was good.  Patient was discharged ambulatory.  Discharged with self.  Valuables were given to: patient.    Electronically signed by Clance Boll, RN at 09/20/2021 10:06 PM EST

## 2022-09-15 ENCOUNTER — Inpatient Hospital Stay
Admit: 2022-09-15 | Discharge: 2022-09-15 | Disposition: A | Discharge status: Home / Self Care | Payer: BLUE CROSS/BLUE SHIELD | Attending: Emergency Medicine

## 2022-09-15 DIAGNOSIS — R519 Headache, unspecified: Secondary | ICD-10-CM

## 2022-09-15 DIAGNOSIS — F1410 Cocaine abuse, uncomplicated: Principal | ICD-10-CM

## 2022-09-15 MED ORDER — BUTALBITAL-APAP-CAFFEINE 50-300-40 MG PO CAPS
50-300-40 | ORAL_CAPSULE | ORAL | 0 refills | Status: DC | PRN
Start: 2022-09-15 — End: 2022-09-15

## 2022-09-15 MED ORDER — KETOROLAC TROMETHAMINE 15 MG/ML IJ SOLN
15 | Freq: Once | INTRAMUSCULAR | Status: AC
Start: 2022-09-15 — End: 2022-09-15
  Administered 2022-09-15: 19:00:00 15 mg via INTRAVENOUS

## 2022-09-15 MED ORDER — BUTALBITAL-APAP-CAFFEINE 50-300-40 MG PO CAPS
50-300-40 | ORAL_CAPSULE | ORAL | 0 refills | Status: AC | PRN
Start: 2022-09-15 — End: ?

## 2022-09-15 MED ORDER — METOCLOPRAMIDE HCL 5 MG/ML IJ SOLN
5 | Freq: Once | INTRAMUSCULAR | Status: AC
Start: 2022-09-15 — End: 2022-09-15
  Administered 2022-09-15: 19:00:00 10 mg via INTRAVENOUS

## 2022-09-15 MED ORDER — DIPHENHYDRAMINE HCL 50 MG/ML IJ SOLN
50 | INTRAMUSCULAR | Status: AC
Start: 2022-09-15 — End: 2022-09-15
  Administered 2022-09-15: 19:00:00 25 mg via INTRAVENOUS

## 2022-09-15 MED FILL — METOCLOPRAMIDE HCL 5 MG/ML IJ SOLN: 5 MG/ML | INTRAMUSCULAR | Qty: 2

## 2022-09-15 MED FILL — DIPHENHYDRAMINE HCL 50 MG/ML IJ SOLN: 50 MG/ML | INTRAMUSCULAR | Qty: 1

## 2022-09-15 MED FILL — KETOROLAC TROMETHAMINE 15 MG/ML IJ SOLN: 15 MG/ML | INTRAMUSCULAR | Qty: 1

## 2022-09-15 NOTE — ED Provider Notes (Signed)
[facility] EMERGENCY DEPT  EMERGENCY DEPARTMENT HISTORY AND PHYSICAL EXAM      Date: 09/15/2022  Patient Name: Tracy Benton  MRN: [id number]  [place] 1991-07-03  Date of evaluation: 09/15/2022  Provider: Keane Police, MD   Note Started: 2:59 PM EST 09/15/22    HISTORY OF PRESENT ILLNESS     Chief Complaint   Patient presents with    Migraine    Headache       History Provided By: Patient    HPI: Tracy Benton is a 32 y.o. female with known past medical history significant for cocaine abuse as well as migraines when she is coming off of her cocaine says that she had a headache this morning and could not get it to go away with 6 BC powder.  She denies any fever, chills, nausea, vomiting, chest pain, shortness of breath, rash, diarrhea, night sweats.  Symptoms are mild to moderate at this point in time.  No changes in her vision but symptoms are made worse with light.    PAST MEDICAL HISTORY   Past Medical History:  No past medical history on file.    Past Surgical History:  No past surgical history on file.    Family History:  No family history on file.    Social History:       Allergies:  No Known Allergies    PCP: No primary care provider on file.    Current Meds:   No current facility-administered medications for this encounter.     Current Outpatient Medications   Medication Sig Dispense Refill    butalbital-APAP-caffeine 50-300-40 MG CAPS per capsule Take 1 capsule by mouth every 4 hours as needed for Headaches 84 capsule 0       Social Determinants of Health:   Social Determinants of Health     Tobacco Use: Not on file   Alcohol Use: Not on file   Financial Resource Strain: Not on file   Food Insecurity: Not on file   Transportation Needs: Not on file   Physical Activity: Not on file   Stress: Not on file   Social Connections: Not on file   Intimate Partner Violence: Not on file   Depression: Not on file   Housing Stability: Not on file   Interpersonal Safety: Not on file   Utilities: Not on file       PHYSICAL EXAM    Physical Exam  Constitutional:       General: She is in acute distress.      Appearance: Normal appearance.   HENT:      Head: Normocephalic and atraumatic.      Right Ear: External ear normal.      Left Ear: External ear normal.      Nose: Nose normal.      Mouth/Throat:      Mouth: Mucous membranes are moist.   Eyes:      Extraocular Movements: Extraocular movements intact.      Conjunctiva/sclera: Conjunctivae normal.      Pupils: Pupils are equal, round, and reactive to light.   Cardiovascular:      Rate and Rhythm: Normal rate and regular rhythm.      Pulses: Normal pulses.      Heart sounds: Normal heart sounds.   Pulmonary:      Effort: Pulmonary effort is normal.      Breath sounds: Normal breath sounds.   Abdominal:      General: Abdomen is flat. Bowel sounds are  normal.      Palpations: Abdomen is soft.   Musculoskeletal:         General: No swelling, tenderness or signs of injury. Normal range of motion.      Cervical back: Normal range of motion and neck supple.   Skin:     General: Skin is warm and dry.      Capillary Refill: Capillary refill takes less than 2 seconds.   Neurological:      General: No focal deficit present.      Mental Status: She is alert and oriented to person, place, and time.   Psychiatric:         Mood and Affect: Mood normal.         Behavior: Behavior normal.         Thought Content: Thought content normal.         Judgment: Judgment normal.           SCREENINGS                   LAB, EKG AND DIAGNOSTIC RESULTS   Labs:  No results found for this or any previous visit (from the past 12 hour(s)).    EKG:.Not Applicable    Radiologic Studies:  Non-plain film images such as CT, Ultrasound and MRI are read by the radiologist. Plain radiographic images are visualized and preliminarily interpreted by the ED Provider with the following findings: Not Applicable.    Interpretation per the Radiologist below, if available at the time of this note:  No orders to display        Williams and DIFFERENTIAL DIAGNOSIS/MDM   3:00 PM DDx, ED Course, and Reassessment: Patient has no focal neurologic deficits does convey that she has a cocaine abuse disorder.  Will treat with Reglan Benadryl and Toradol and reevaluate  Patient's been reevaluated she is sleeping well says her headache is resolved.  We had an extensive discussion with regard to substance abuse and cessation.      Records Reviewed (source and summary of external notes): Prior medical records and Nursing notes.    Vitals:    Vitals:    09/15/22 1352   BP: (!) 144/77   Pulse: 73   Resp: 18   Temp: 97.2 F (36.2 C)   SpO2: 100%        ED COURSE       SEPSIS Reassessment: Sepsis reassessment not applicable    Clinical Management Tools:      Patient was given the following medications:  Medications   metoclopramide (REGLAN) injection 10 mg (10 mg IntraVENous Given 09/15/22 1416)   diphenhydrAMINE (BENADRYL) injection 25 mg (25 mg IntraVENous Given 09/15/22 1416)   ketorolac (TORADOL) injection 15 mg (15 mg IntraVENous Given 09/15/22 1417)       CONSULTS:   None     Social Determinants affecting Diagnosis/Treatment: Patient has a substance abuse problem. Given Substance Abuse resources.    Smoking Cessation: Not Applicable    PROCEDURES   Unless otherwise noted above, none  Procedures      CRITICAL CARE TIME   Patient does not meet Critical Care Time, 0 minutes    ED FINAL IMPRESSION     1. Nonintractable headache, unspecified chronicity pattern, unspecified headache type    2. Nondependent cocaine abuse [facility])          DISPOSITION/PLAN   DISPOSITION      Discharge Note: The patient is stable for discharge  home. The signs, symptoms, diagnosis, and discharge instructions have been discussed, understanding conveyed, and agreed upon. The patient is to follow up as recommended or return to ER should their symptoms worsen.      PATIENT REFERRED TO:  [facility] EMERGENCY DEPT  [place] [id number]  [phone]  Call   As  needed, If symptoms worsen        DISCHARGE MEDICATIONS:     Medication List        START taking these medications      butalbital-APAP-caffeine 50-300-40 MG Caps per capsule  Take 1 capsule by mouth every 4 hours as needed for Headaches               Where to Get Your Medications        Information about where to get these medications is not yet available    Ask your nurse or doctor about these medications  butalbital-APAP-caffeine 50-300-40 MG Caps per capsule           DISCONTINUED MEDICATIONS:  Current Discharge Medication List          I am the Primary Clinician of Record. Keane Police, MD (electronically signed)    (Please note that parts of this dictation were completed with voice recognition software. Quite often unanticipated grammatical, syntax, homophones, and other interpretive errors are inadvertently transcribed by the computer software. Please disregards these errors. Please excuse any errors that have escaped final proofreading.)     Elder Cyphers, MD  09/15/22 1500

## 2022-09-15 NOTE — ED Notes (Signed)
Paperwork read with patient making note of where to pick up prescriptions    IV taken out    Armband removed and disposed of in shredder.     Patient has no further questions at this time.     Will discharge from system.

## 2022-09-15 NOTE — ED Triage Notes (Signed)
Pt arrived via ems from urgent care with worst headache of her life. Sts hx of cocaine use daily, headache after cocaine use this am. Sts it started to worsen about 1130.   Sts  she has daily headaches. Took 6 BC powders today. Sts her head was throbbing.   No blurred vision, no numbness or tingling.
